# Patient Record
Sex: Female | Born: 1975 | Race: Black or African American | Hispanic: No | Marital: Married | Smoking: Never smoker
Health system: Southern US, Community
[De-identification: ages and names within clinical notes are randomized; demographics above are authoritative.]

## PROBLEM LIST (undated history)

## (undated) DIAGNOSIS — Z789 Other specified health status: Secondary | ICD-10-CM

## (undated) DIAGNOSIS — R42 Dizziness and giddiness: Secondary | ICD-10-CM

## (undated) HISTORY — DX: Other specified health status: Z78.9

## (undated) HISTORY — PX: OTHER SURGICAL HISTORY: SHX169

---

## 2018-03-11 IMAGING — US US TRANSVAGINAL NON-OB
1 series · 13 of 25 positions shown · non-contrast
Comparison: None.

CLINICAL DATA: 42-year-old female with acute pelvic pain for 1
week. Negative pregnancy test.



[Series 1: us transvaginal non-ob · 0.18mm/px · 13 of 114 slices shown]
[im 1/114]
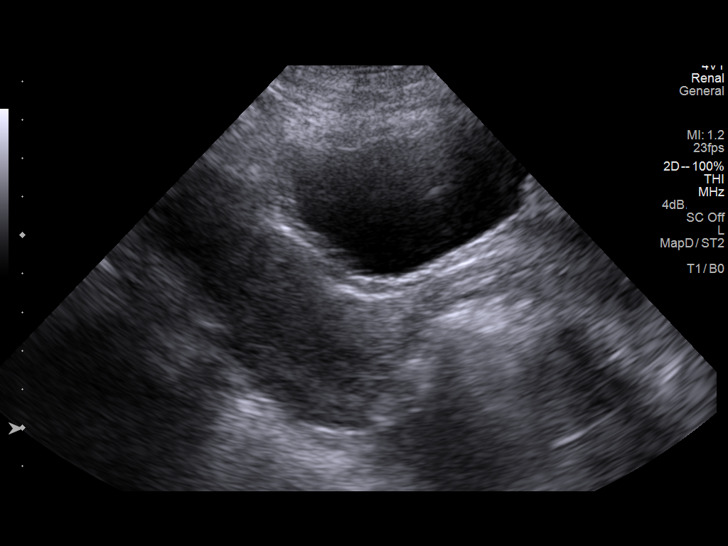
[im 10/114]
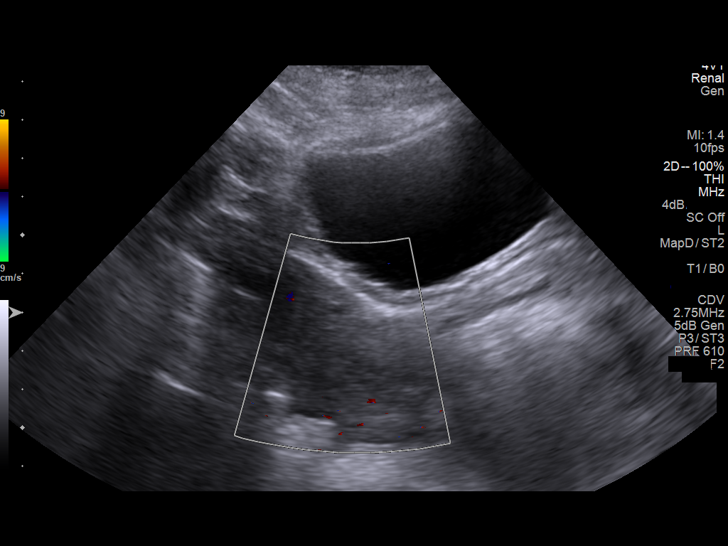
[im 19/114]
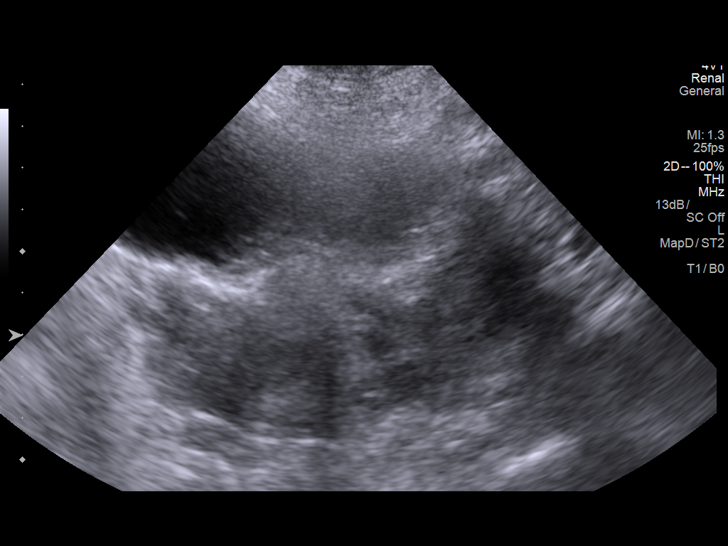
[im 29/114]
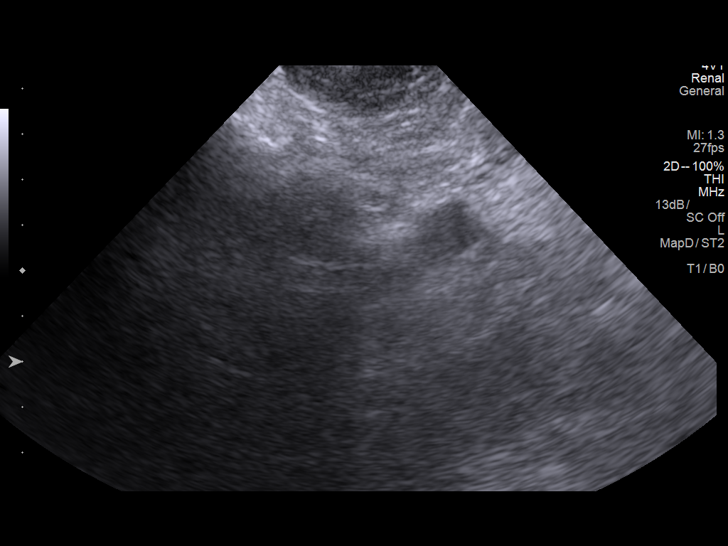
[im 38/114]
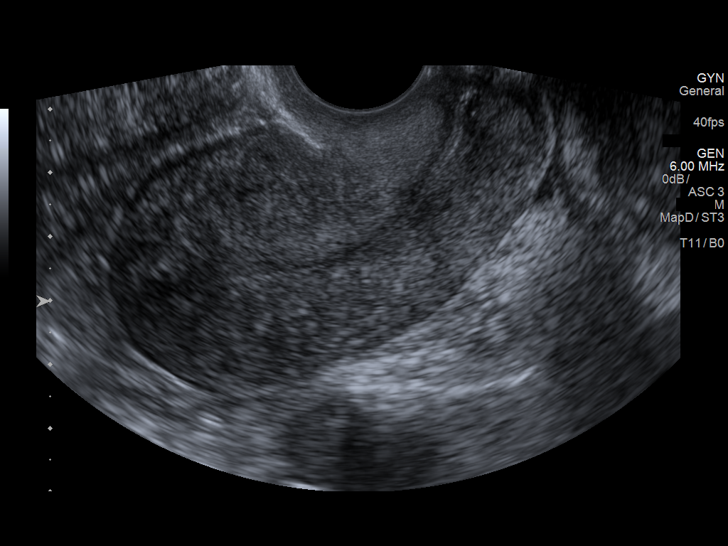
[im 48/114]
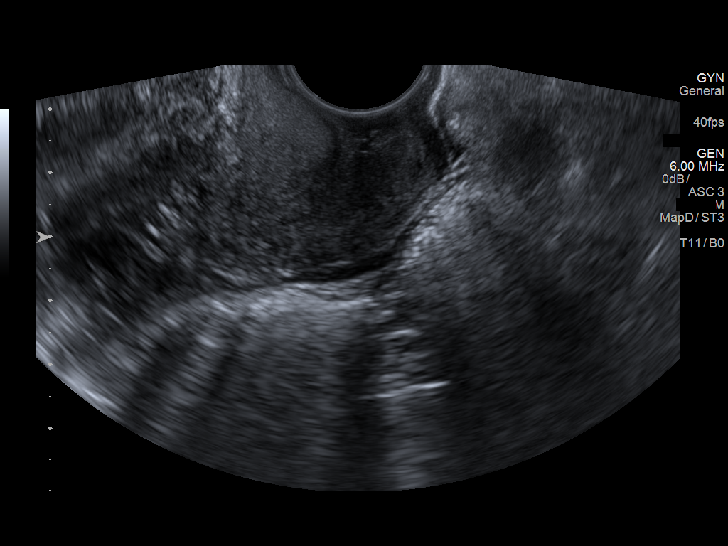
[im 57/114]
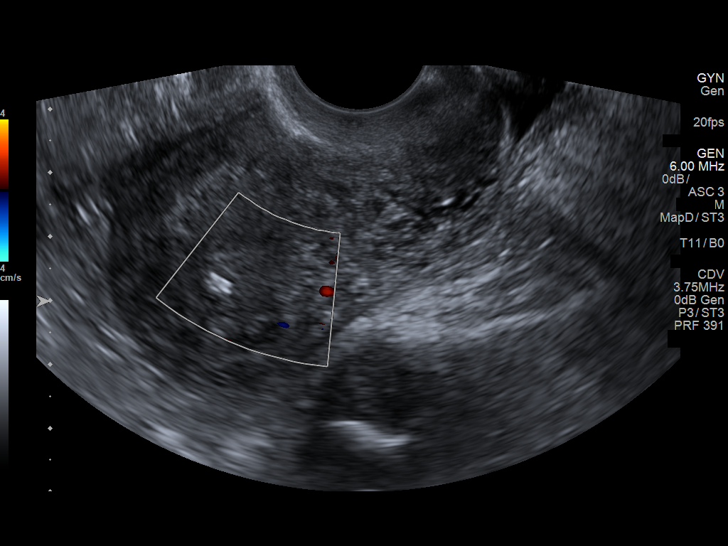
[im 66/114]
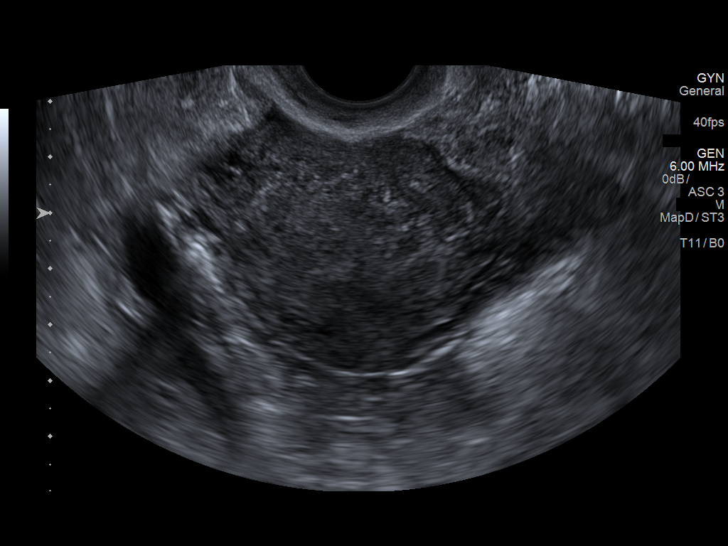
[im 76/114]
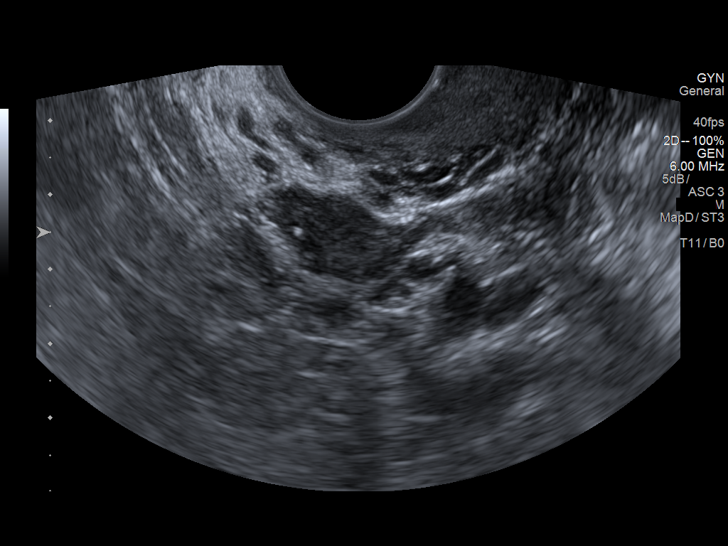
[im 85/114]
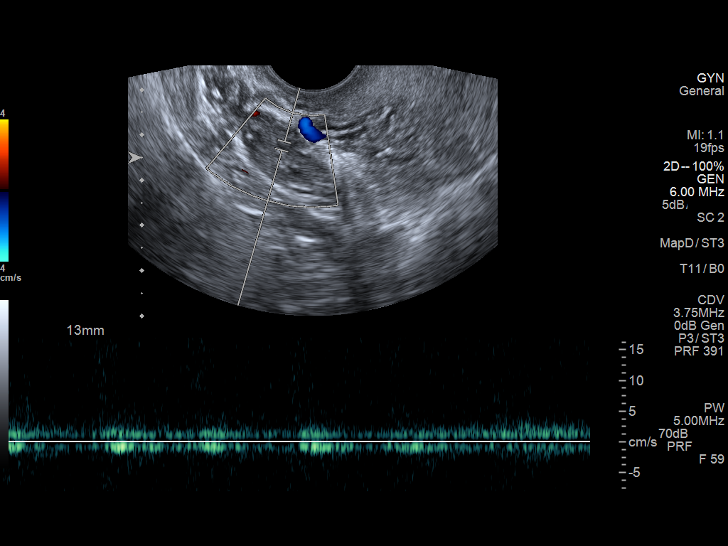
[im 95/114]
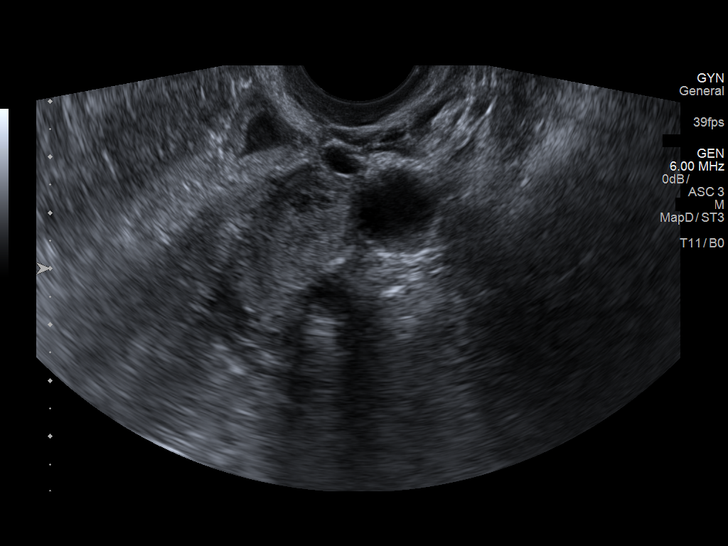
[im 104/114]
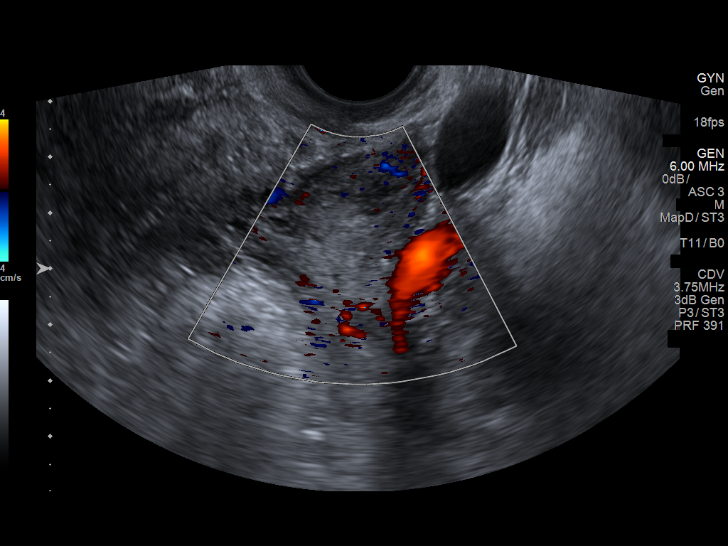
[im 114/114]
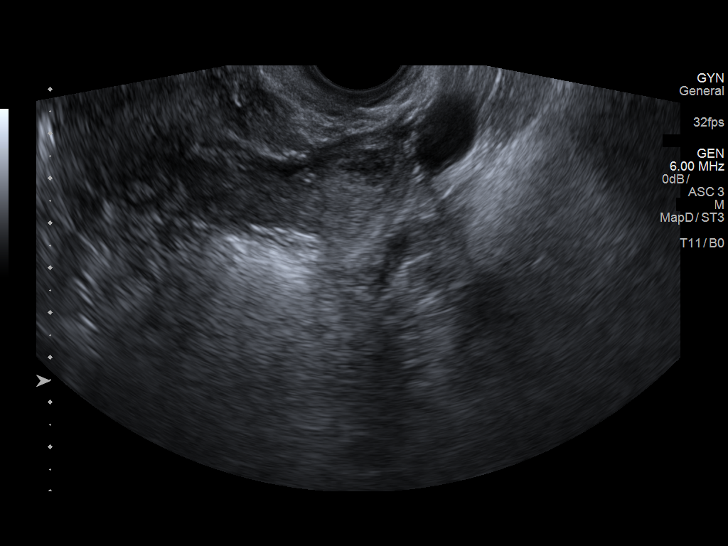

[13 of 25 positions shown; findings below may reference images not displayed]

FINDINGS: Uterus

Measurements: 7.7 x 3.9 x 4.5 cm. A 8 mm hyperechoic structure
within the uterine fundus adjacent to the endometrial stripe may
represent a calcification possibly representing prior
inflammation/infection or instrumentation.. No fibroids or other
mass visualized.

Endometrium

Thickness: 4 mm..  No focal abnormality visualized.

Right ovary

Measurements: 2.6 x 1.3 x 1.6 cm.. Normal appearance/no adnexal
mass.

Left ovary

Measurements: 4.9 x 2.3 x 2.6 cm. A 3 x 1.7 x 1.8 cm complex cyst is
noted.. No other abnormalities identified.

Pulsed Doppler evaluation of both ovaries demonstrates normal
low-resistance arterial and venous waveforms.

Other findings

Trace free fluid noted.
IMPRESSION: 1. 3 x 1.7 x 1.8 cm complex LEFT ovarian cyst which could represent
a corpus luteum. Follow-up ultrasound in 6-12 weeks recommended.
2. 8 mm hypoechoic structure within the uterine fundus along the
endometrium probably representing a calcification representing prior
inflammation/infection or instrumentation.
3. No evidence of ovarian torsion.
4. Trace free fluid which may be physiologic.

## 2018-04-30 ENCOUNTER — Other Ambulatory Visit: Payer: Self-pay

## 2018-04-30 ENCOUNTER — Emergency Department (HOSPITAL_COMMUNITY)
Admission: EM | Admit: 2018-04-30 | Discharge: 2018-04-30 | Disposition: A | Discharge status: Home / Self Care | Payer: Medicaid Other | Attending: Emergency Medicine | Admitting: Emergency Medicine

## 2018-04-30 ENCOUNTER — Encounter (HOSPITAL_COMMUNITY): Payer: Self-pay | Admitting: Emergency Medicine

## 2018-04-30 ENCOUNTER — Emergency Department (HOSPITAL_COMMUNITY): Payer: Medicaid Other

## 2018-04-30 DIAGNOSIS — R102 Pelvic and perineal pain: Secondary | ICD-10-CM | POA: Diagnosis present

## 2018-04-30 DIAGNOSIS — B9689 Other specified bacterial agents as the cause of diseases classified elsewhere: Secondary | ICD-10-CM | POA: Insufficient documentation

## 2018-04-30 DIAGNOSIS — N83202 Unspecified ovarian cyst, left side: Secondary | ICD-10-CM | POA: Diagnosis not present

## 2018-04-30 DIAGNOSIS — N760 Acute vaginitis: Secondary | ICD-10-CM | POA: Diagnosis not present

## 2018-04-30 DIAGNOSIS — R300 Dysuria: Secondary | ICD-10-CM | POA: Diagnosis not present

## 2018-04-30 LAB — CBC WITH DIFFERENTIAL/PLATELET
Abs Immature Granulocytes: 0 10*3/uL (ref 0.0–0.1)
Basophils Absolute: 0 10*3/uL (ref 0.0–0.1)
Basophils Relative: 1 %
Eosinophils Absolute: 0.2 10*3/uL (ref 0.0–0.7)
Eosinophils Relative: 4 %
HCT: 36.5 % (ref 36.0–46.0)
Hemoglobin: 11.4 g/dL — ABNORMAL LOW (ref 12.0–15.0)
Immature Granulocytes: 0 %
Lymphocytes Relative: 40 %
Lymphs Abs: 2 10*3/uL (ref 0.7–4.0)
MCH: 26.3 pg (ref 26.0–34.0)
MCHC: 31.2 g/dL (ref 30.0–36.0)
MCV: 84.1 fL (ref 78.0–100.0)
Monocytes Absolute: 0.5 10*3/uL (ref 0.1–1.0)
Monocytes Relative: 10 %
Neutro Abs: 2.3 10*3/uL (ref 1.7–7.7)
Neutrophils Relative %: 45 %
Platelets: 299 10*3/uL (ref 150–400)
RBC: 4.34 MIL/uL (ref 3.87–5.11)
RDW: 13.2 % (ref 11.5–15.5)
WBC: 5 10*3/uL (ref 4.0–10.5)

## 2018-04-30 LAB — URINALYSIS, ROUTINE W REFLEX MICROSCOPIC
Bacteria, UA: NONE SEEN
Bilirubin Urine: NEGATIVE
Glucose, UA: NEGATIVE mg/dL
Ketones, ur: NEGATIVE mg/dL
Leukocytes, UA: NEGATIVE
Nitrite: NEGATIVE
Protein, ur: NEGATIVE mg/dL
Specific Gravity, Urine: 1.02 (ref 1.005–1.030)
pH: 6 (ref 5.0–8.0)

## 2018-04-30 LAB — COMPREHENSIVE METABOLIC PANEL
ALT: 16 U/L (ref 14–54)
AST: 18 U/L (ref 15–41)
Albumin: 3.4 g/dL — ABNORMAL LOW (ref 3.5–5.0)
Alkaline Phosphatase: 48 U/L (ref 38–126)
Anion gap: 5 (ref 5–15)
BUN: 8 mg/dL (ref 6–20)
CO2: 25 mmol/L (ref 22–32)
Calcium: 9.5 mg/dL (ref 8.9–10.3)
Chloride: 112 mmol/L — ABNORMAL HIGH (ref 101–111)
Creatinine, Ser: 0.69 mg/dL (ref 0.44–1.00)
GFR calc Af Amer: >60 mL/min (ref >60)
GFR calc non Af Amer: >60 mL/min (ref >60)
Glucose, Bld: 92 mg/dL (ref 65–99)
Potassium: 3.8 mmol/L (ref 3.5–5.1)
Sodium: 142 mmol/L (ref 135–145)
Total Bilirubin: 0.5 mg/dL (ref 0.3–1.2)
Total Protein: 6.5 g/dL (ref 6.5–8.1)

## 2018-04-30 LAB — WET PREP, GENITAL
Sperm: NONE SEEN
Trich, Wet Prep: NONE SEEN
Yeast Wet Prep HPF POC: NONE SEEN

## 2018-04-30 LAB — POC URINE PREG, ED: Preg Test, Ur: NEGATIVE

## 2018-04-30 MED ORDER — IBUPROFEN 800 MG PO TABS: 800.0000 mg | ORAL_TABLET | Freq: Three times a day (TID) | ORAL | 0 refills | Status: DC

## 2018-04-30 MED ORDER — KETOROLAC TROMETHAMINE 30 MG/ML IJ SOLN
30.0000 mg | Freq: Once | INTRAMUSCULAR | Status: AC
  Administered 2018-04-30: 30 mg via INTRAVENOUS
  Filled 2018-04-30: qty 1

## 2018-04-30 MED ORDER — SODIUM CHLORIDE 0.9 % IV BOLUS
1000.0000 mL | Freq: Once | INTRAVENOUS | Status: AC
  Administered 2018-04-30: 1000 mL via INTRAVENOUS

## 2018-04-30 MED ORDER — METRONIDAZOLE 500 MG PO TABS: 500.0000 mg | ORAL_TABLET | Freq: Two times a day (BID) | ORAL | 0 refills | Status: DC

## 2018-04-30 MED ORDER — METRONIDAZOLE 500 MG PO TABS
500.0000 mg | ORAL_TABLET | Freq: Once | ORAL | Status: AC
  Administered 2018-04-30: 500 mg via ORAL
  Filled 2018-04-30: qty 1

## 2018-04-30 NOTE — Discharge Instructions (Addendum)
Take motrin as needed for pain.   Take flagyl twice daily for a week.   [facility] if you still have pain   Return to ER if you have worse abdominal pain, vomiting, vaginal discharge, fever.

## 2018-04-30 NOTE — ED Notes (Addendum)
Please note: Gold top also collected with CMP and CBC; all have been sent down to Main Lab with instructions to hold.

## 2018-04-30 NOTE — ED Notes (Signed)
Pt verbalized understanding of discharge instructions.

## 2018-04-30 NOTE — ED Provider Notes (Signed)
[facility] EMERGENCY DEPARTMENT Provider Note   CSN: [id number] Arrival date & time: 04/30/18  8657     History   Chief Complaint Chief Complaint  Patient presents with  . Abdominal Pain    HPI Sara Rich is a 42 y.o. female here presenting with pelvic pain, dysuria, pelvic discharge.  Vision states that she has lower abdominal pain for the last week or so.  She states that is diffuse and cramping in sensation.  2 days ago she started her period and her pain got worse.  She denies any nausea or vomiting or diarrhea or fevers.  She states that she is concerned that she may be pregnant since didn't get a new implant for several years.   The history is provided by the patient.    No past medical history on file.  There are no active problems to display for this patient.    OB History   None      Home Medications    Prior to Admission medications   Not on File    Family History No family history on file.  Social History Social History   Tobacco Use  . Smoking status: Never Smoker  Substance Use Topics  . Alcohol use: Never    Frequency: Never  . Drug use: Never     Allergies   Patient has no allergy information on record.   Review of Systems Review of Systems  Gastrointestinal: Positive for abdominal pain.  Genitourinary: Positive for dysuria, pelvic pain and vaginal discharge.  All other systems reviewed and are negative.    Physical Exam Updated Vital Signs BP 120/78   Pulse (!) 53   Temp 98.4 F (36.9 C) (Oral)   Resp 18   SpO2 100%   Physical Exam  Constitutional: She appears well-developed.  HENT:  Head: Normocephalic.  Mouth/Throat: Oropharynx is clear and moist.  Eyes: Pupils are equal, round, and reactive to light. EOM are normal.  Cardiovascular: Normal rate, regular rhythm and normal heart sounds.  Pulmonary/Chest: Effort normal and breath sounds normal.  Abdominal: Normal appearance and bowel sounds  are normal.  Mild diffuse lower abdominal and pelvic tenderness, no rebound   Genitourinary:  Genitourinary Comments: Some blood around the os. Os closed and no CMT. Diffuse pelvic tenderness   Neurological: She is alert.  Skin: Skin is warm. Capillary refill takes less than 2 seconds.  Nursing note and vitals reviewed.    ED Treatments / Results  Labs (all labs ordered are listed, but only abnormal results are displayed) Labs Reviewed  WET PREP, GENITAL - Abnormal; Notable for the following components:      Result Value   Clue Cells Wet Prep HPF POC PRESENT (*)    WBC, Wet Prep HPF POC MANY (*)    All other components within normal limits  URINALYSIS, ROUTINE W REFLEX MICROSCOPIC - Abnormal; Notable for the following components:   Hgb urine dipstick MODERATE (*)    All other components within normal limits  CBC WITH DIFFERENTIAL/PLATELET - Abnormal; Notable for the following components:   Hemoglobin 11.4 (*)    All other components within normal limits  COMPREHENSIVE METABOLIC PANEL - Abnormal; Notable for the following components:   Chloride 112 (*)    Albumin 3.4 (*)    All other components within normal limits  POC URINE PREG, ED  GC/CHLAMYDIA PROBE AMP ([employer]) NOT AT [facility]    EKG None  Radiology US Transvaginal Non-ob  Result Date:  04/30/2018 CLINICAL DATA:  42 year old female with acute pelvic pain for 1 week. Negative pregnancy test. EXAM: TRANSABDOMINAL AND TRANSVAGINAL ULTRASOUND OF PELVIS DOPPLER ULTRASOUND OF OVARIES TECHNIQUE: Both transabdominal and transvaginal ultrasound examinations of the pelvis were performed. Transabdominal technique was performed for global imaging of the pelvis including uterus, ovaries, adnexal regions, and pelvic cul-de-sac. It was necessary to proceed with endovaginal exam following the transabdominal exam to visualize the ovaries and endometrium. Color and duplex Doppler ultrasound was utilized to evaluate blood flow to the  ovaries. COMPARISON:  None. FINDINGS: Uterus Measurements: 7.7 x 3.9 x 4.5 cm. A 8 mm hyperechoic structure within the uterine fundus adjacent to the endometrial stripe may represent a calcification possibly representing prior inflammation/infection or instrumentation. No fibroids or other mass visualized. Endometrium Thickness: 4 mm.  No focal abnormality visualized. Right ovary Measurements: 2.6 x 1.3 x 1.6 cm. Normal appearance/no adnexal mass. Left ovary Measurements: 4.9 x 2.3 x 2.6 cm. A 3 x 1.7 x 1.8 cm complex cyst is noted. No other abnormalities identified. Pulsed Doppler evaluation of both ovaries demonstrates normal low-resistance arterial and venous waveforms. Other findings Trace free fluid noted. IMPRESSION: 1. 3 x 1.7 x 1.8 cm complex LEFT ovarian cyst which could represent a corpus luteum. Follow-up ultrasound in 6-12 weeks recommended. 2. 8 mm hypoechoic structure within the uterine fundus along the endometrium probably representing a calcification representing prior inflammation/infection or instrumentation. 3. No evidence of ovarian torsion. 4. Trace free fluid which may be physiologic. Electronically Signed   By: Harmon Pier M.D.   On: 04/30/2018 14:47   US Pelvis Complete  Result Date: 04/30/2018 CLINICAL DATA:  42 year old female with acute pelvic pain for 1 week. Negative pregnancy test. EXAM: TRANSABDOMINAL AND TRANSVAGINAL ULTRASOUND OF PELVIS DOPPLER ULTRASOUND OF OVARIES TECHNIQUE: Both transabdominal and transvaginal ultrasound examinations of the pelvis were performed. Transabdominal technique was performed for global imaging of the pelvis including uterus, ovaries, adnexal regions, and pelvic cul-de-sac. It was necessary to proceed with endovaginal exam following the transabdominal exam to visualize the ovaries and endometrium. Color and duplex Doppler ultrasound was utilized to evaluate blood flow to the ovaries. COMPARISON:  None. FINDINGS: Uterus Measurements: 7.7 x 3.9 x 4.5  cm. A 8 mm hyperechoic structure within the uterine fundus adjacent to the endometrial stripe may represent a calcification possibly representing prior inflammation/infection or instrumentation. No fibroids or other mass visualized. Endometrium Thickness: 4 mm.  No focal abnormality visualized. Right ovary Measurements: 2.6 x 1.3 x 1.6 cm. Normal appearance/no adnexal mass. Left ovary Measurements: 4.9 x 2.3 x 2.6 cm. A 3 x 1.7 x 1.8 cm complex cyst is noted. No other abnormalities identified. Pulsed Doppler evaluation of both ovaries demonstrates normal low-resistance arterial and venous waveforms. Other findings Trace free fluid noted. IMPRESSION: 1. 3 x 1.7 x 1.8 cm complex LEFT ovarian cyst which could represent a corpus luteum. Follow-up ultrasound in 6-12 weeks recommended. 2. 8 mm hypoechoic structure within the uterine fundus along the endometrium probably representing a calcification representing prior inflammation/infection or instrumentation. 3. No evidence of ovarian torsion. 4. Trace free fluid which may be physiologic. Electronically Signed   By: Harmon Pier M.D.   On: 04/30/2018 14:47   [place] Art/ven Flow Abd Pelv Doppler  Result Date: 04/30/2018 CLINICAL DATA:  42 year old female with acute pelvic pain for 1 week. Negative pregnancy test. EXAM: TRANSABDOMINAL AND TRANSVAGINAL ULTRASOUND OF PELVIS DOPPLER ULTRASOUND OF OVARIES TECHNIQUE: Both transabdominal and transvaginal ultrasound examinations of the pelvis were performed. Transabdominal technique  was performed for global imaging of the pelvis including uterus, ovaries, adnexal regions, and pelvic cul-de-sac. It was necessary to proceed with endovaginal exam following the transabdominal exam to visualize the ovaries and endometrium. Color and duplex Doppler ultrasound was utilized to evaluate blood flow to the ovaries. COMPARISON:  None. FINDINGS: Uterus Measurements: 7.7 x 3.9 x 4.5 cm. A 8 mm hyperechoic structure within the uterine fundus  adjacent to the endometrial stripe may represent a calcification possibly representing prior inflammation/infection or instrumentation. No fibroids or other mass visualized. Endometrium Thickness: 4 mm.  No focal abnormality visualized. Right ovary Measurements: 2.6 x 1.3 x 1.6 cm. Normal appearance/no adnexal mass. Left ovary Measurements: 4.9 x 2.3 x 2.6 cm. A 3 x 1.7 x 1.8 cm complex cyst is noted. No other abnormalities identified. Pulsed Doppler evaluation of both ovaries demonstrates normal low-resistance arterial and venous waveforms. Other findings Trace free fluid noted. IMPRESSION: 1. 3 x 1.7 x 1.8 cm complex LEFT ovarian cyst which could represent a corpus luteum. Follow-up ultrasound in 6-12 weeks recommended. 2. 8 mm hypoechoic structure within the uterine fundus along the endometrium probably representing a calcification representing prior inflammation/infection or instrumentation. 3. No evidence of ovarian torsion. 4. Trace free fluid which may be physiologic. Electronically Signed   By: Harmon Pier M.D.   On: 04/30/2018 14:47    Procedures Procedures (including critical care time)  Medications Ordered in ED Medications  metroNIDAZOLE (FLAGYL) tablet 500 mg (has no administration in time range)  sodium chloride 0.9 % bolus 1,000 mL (0 mLs Intravenous Stopped 04/30/18 1317)  ketorolac (TORADOL) 30 MG/ML injection 30 mg (30 mg Intravenous Given 04/30/18 1256)     Initial Impression / Assessment and Plan / ED Course  I have reviewed the triage vital signs and the nursing notes.  Pertinent labs & imaging results that were available during my care of the patient were reviewed by me and considered in my medical decision making (see chart for details).     Donatella Frayre is a 42 y.o. female here with abdominal pain, pelvic discharge. Consider PID vs UTI vs tuboovarian abscess. Will get labs, US pelvis, UA.   3:51 PM US showed L ovarian cyst. UCG neg. Wet prep + BV. UA showed no  obvious UTI. Pain controlled with toradol. Will dc home with flagyl, motrin. Will have her follow up at [facility] for repeat US.    Final Clinical Impressions(s) / ED Diagnoses   Final diagnoses:  None    ED Discharge Orders    None       Charlynne Pander, MD 04/30/18 1553

## 2018-04-30 NOTE — ED Notes (Signed)
Pt transported to Ultrasound.  

## 2018-04-30 NOTE — ED Triage Notes (Signed)
Patient to ED c/o lower abdominal pain, intermittent x 1 week with urinary frequency. Patient denies fevers/chills. She states her main concern, however, is the implant in her L arm has expired (been there for 3 years) and the person who put it in is in [place]. Denies pain or problems with implant in arm.

## 2018-04-30 NOTE — ED Notes (Signed)
Pt remains in imaging at this time. Family at the bedside.

## 2018-05-01 LAB — GC/CHLAMYDIA PROBE AMP (~~LOC~~) NOT AT ARMC
Chlamydia: NEGATIVE
Neisseria Gonorrhea: NEGATIVE

## 2018-05-26 ENCOUNTER — Other Ambulatory Visit: Payer: Self-pay | Admitting: Nurse Practitioner

## 2018-05-26 DIAGNOSIS — Z1231 Encounter for screening mammogram for malignant neoplasm of breast: Secondary | ICD-10-CM

## 2018-05-29 ENCOUNTER — Ambulatory Visit
Admission: RE | Admit: 2018-05-29 | Discharge: 2018-05-29 | Disposition: A | Discharge status: Home / Self Care | Payer: Medicaid Other | Source: Ambulatory Visit | Attending: Nurse Practitioner | Admitting: Nurse Practitioner

## 2018-05-29 DIAGNOSIS — Z1231 Encounter for screening mammogram for malignant neoplasm of breast: Secondary | ICD-10-CM

## 2018-06-01 ENCOUNTER — Ambulatory Visit: Payer: Self-pay | Admitting: Nurse Practitioner

## 2018-06-01 ENCOUNTER — Ambulatory Visit: Payer: Medicaid Other | Admitting: Nurse Practitioner

## 2018-06-22 IMAGING — US US PELVIS COMPLETE
1 series · 13 of 25 positions shown · non-contrast
Comparison: None

CLINICAL DATA: Follow-up left ovarian cyst

EXAM:
TRANSABDOMINAL AND TRANSVAGINAL ULTRASOUND OF PELVIS
TECHNIQUE: Both transabdominal and transvaginal ultrasound examinations of the
pelvis were performed. Transabdominal technique was performed for
global imaging of the pelvis including uterus, ovaries, adnexal
regions, and pelvic cul-de-sac. It was necessary to proceed with
endovaginal exam following the transabdominal exam to visualize the
endometrium and ovaries.

[Series 1: us pelvis complete · 0.24mm/px · 13 of 118 slices shown]
[im 1/118]
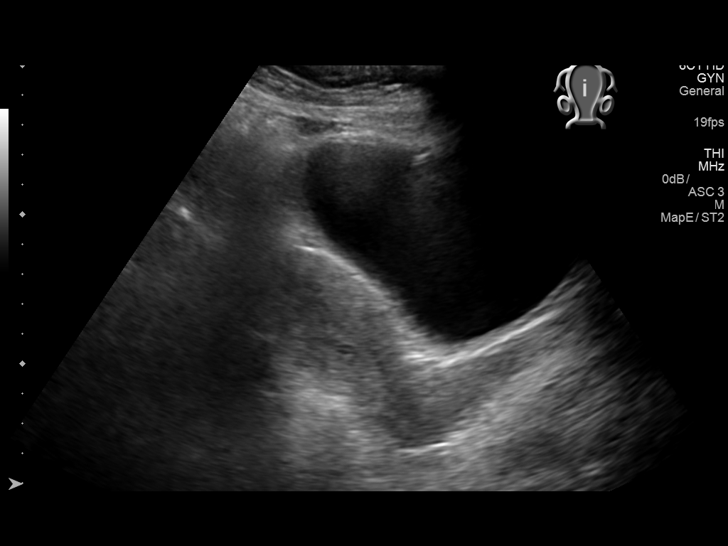
[im 10/118]
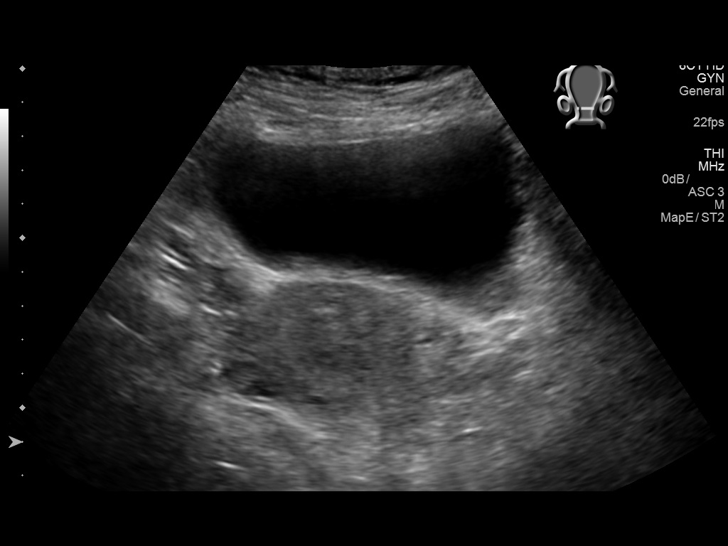
[im 20/118]
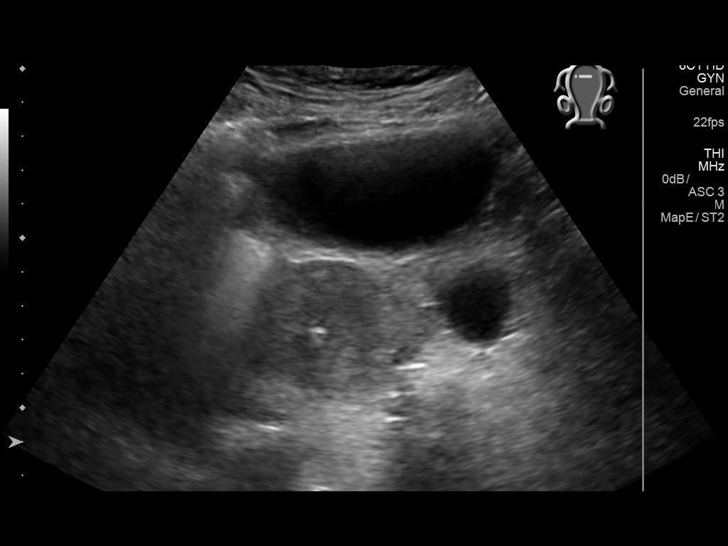
[im 30/118]
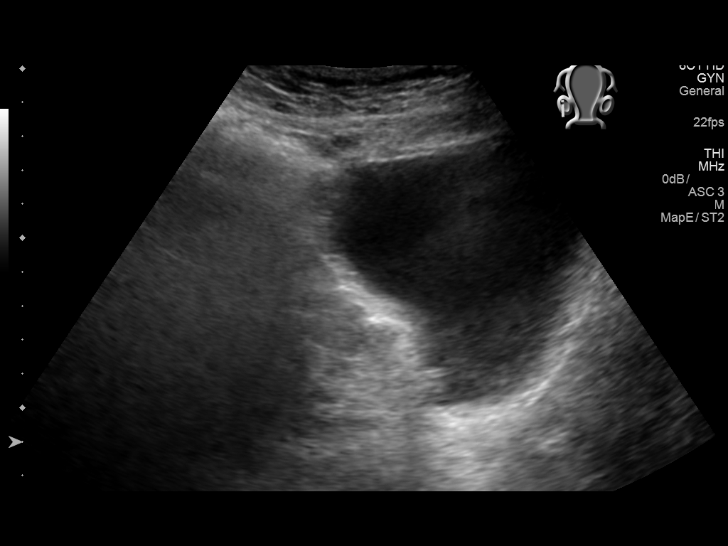
[im 40/118]
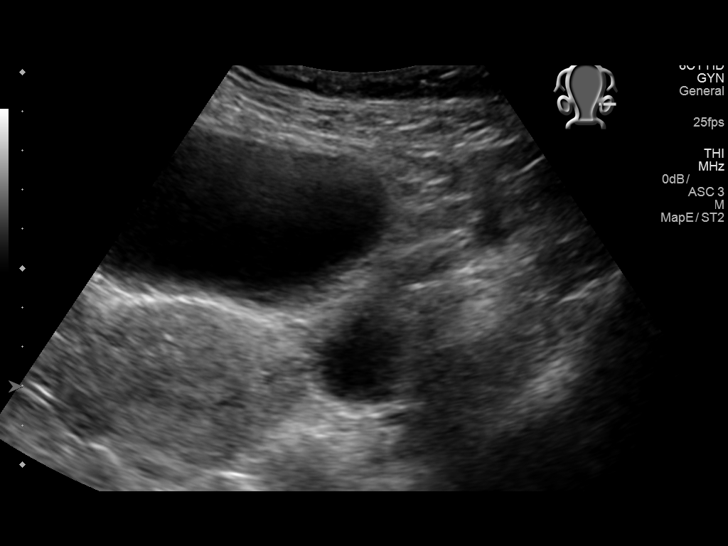
[im 49/118]
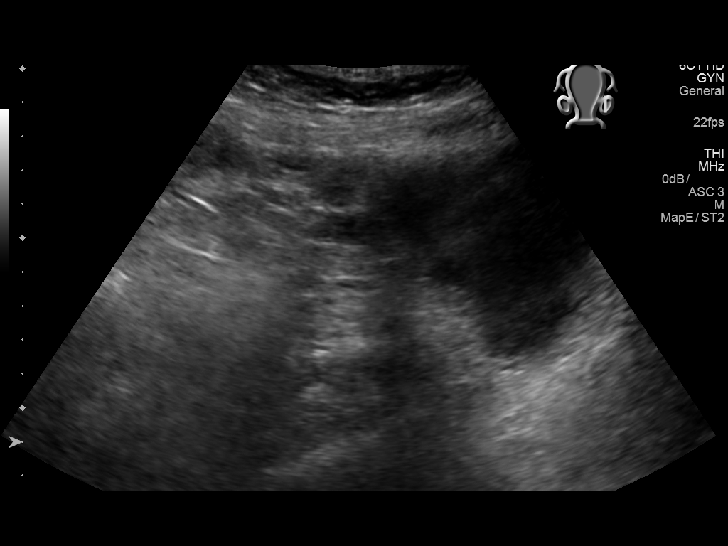
[im 59/118]
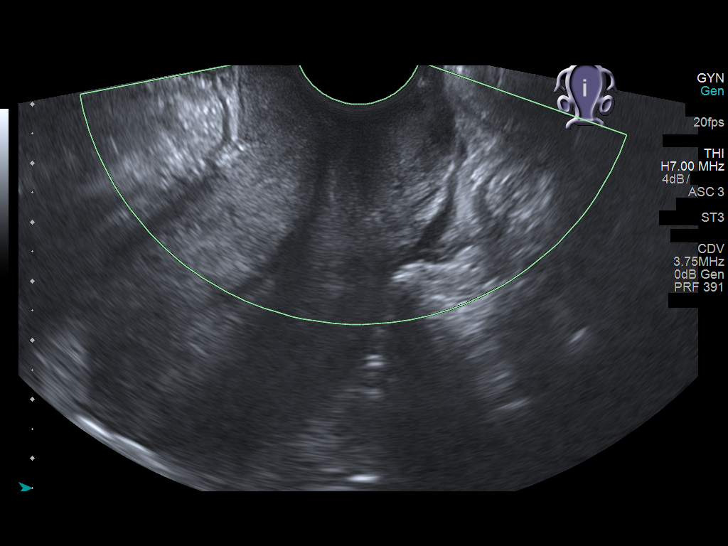
[im 69/118]
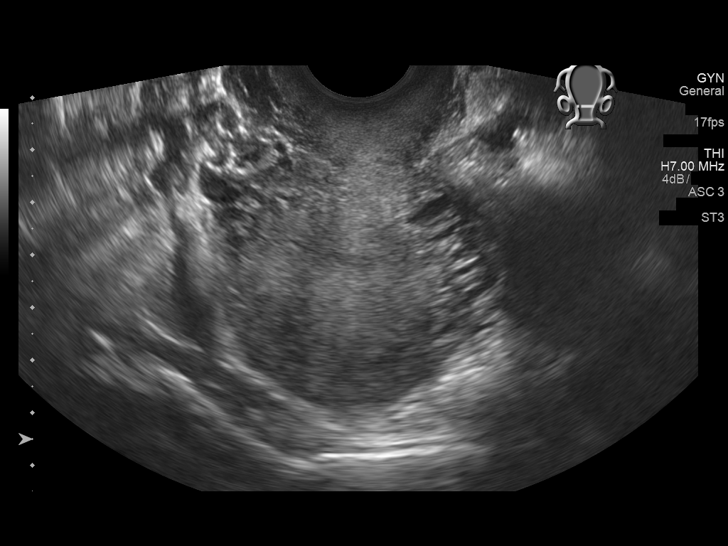
[im 79/118]
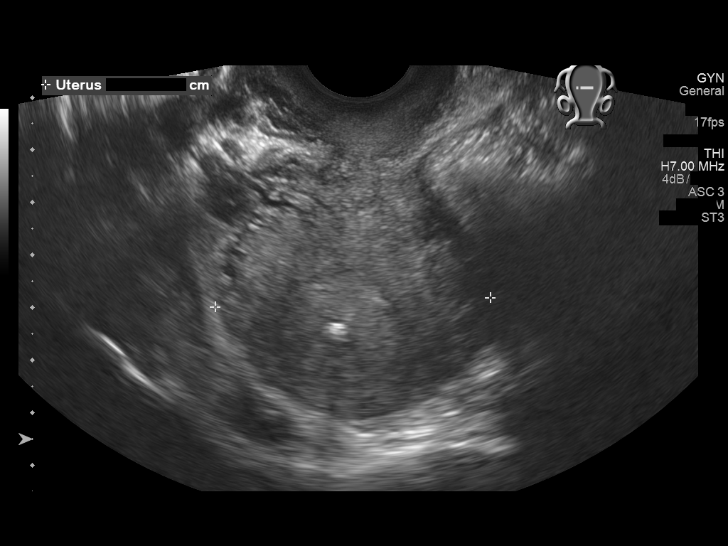
[im 88/118]
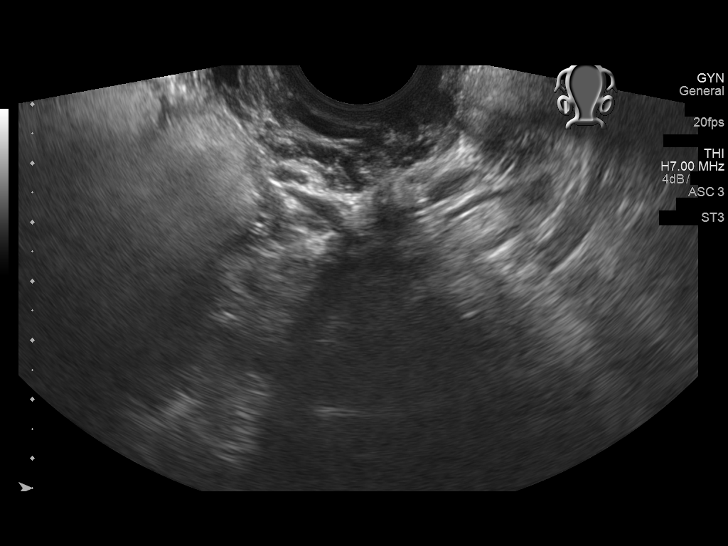
[im 98/118]
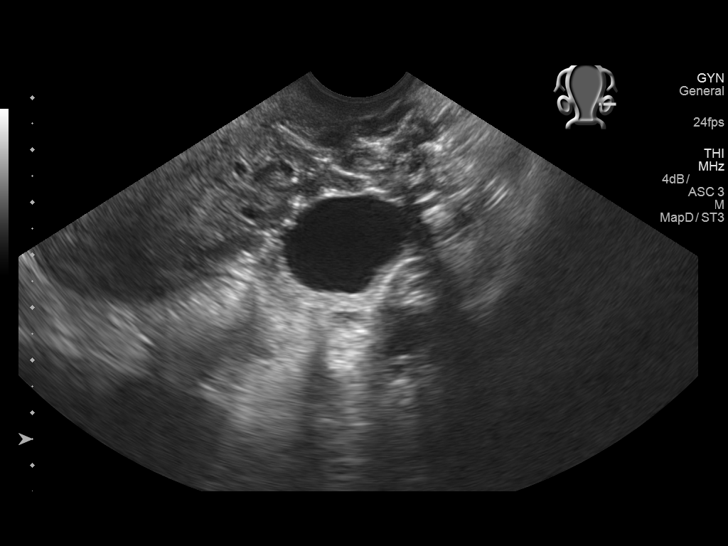
[im 108/118]
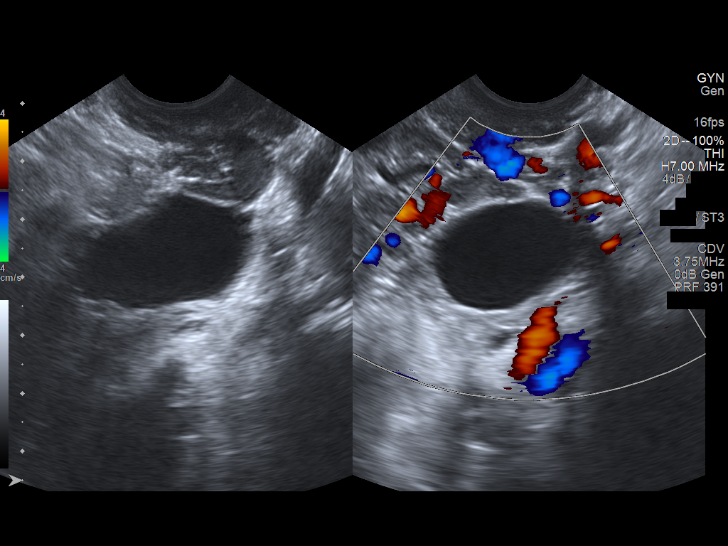
[im 118/118]
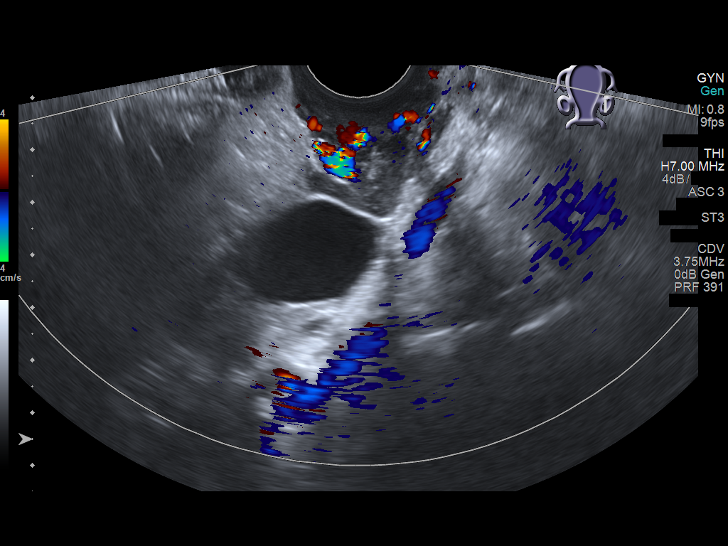

[13 of 25 positions shown; findings below may reference images not displayed]

FINDINGS: Uterus

Measurements: 7.9 x 4.6 x 5.2 cm. A 7.3 x 4.3 x 6.6 mm calcification
is seen in the uterine fundus, not significantly changed in the
interval.

Endometrium

Thickness: 6.9 mm.  No focal abnormality visualized.

Right ovary

Not visualized.

Left ovary

Measurements: 3.25 x 2.27 x 3.62 cm. The complicated cyst has
resolved. There is now a 2.9 cm dominant follicle requiring no
follow-up.

Other findings

Trace fluid in the cul-de-sac, physiologic.
IMPRESSION: 1. A 7 mm calcification in the uterine fundus is likely sequela of
previous infection/inflammation or instrumentation. No change.
2. The complicated cyst in the left ovary has resolved. A dominant
2.9 cm anechoic follicle is seen, requiring no follow-up.
3. The right ovary is not seen.
4. Trace physiologic fluid in the cul-de-sac.

## 2018-07-09 ENCOUNTER — Ambulatory Visit: Payer: Medicaid Other | Admitting: Internal Medicine

## 2018-08-03 ENCOUNTER — Ambulatory Visit: Payer: Medicaid Other | Attending: Internal Medicine | Admitting: Internal Medicine

## 2018-08-03 ENCOUNTER — Encounter: Payer: Self-pay | Admitting: Internal Medicine

## 2018-08-03 ENCOUNTER — Telehealth: Payer: Self-pay

## 2018-08-03 ENCOUNTER — Telehealth: Payer: Self-pay | Admitting: *Deleted

## 2018-08-03 VITALS — BP 133/81 | HR 61 | Temp 98.3°F | Resp 16 | Ht 64.5 in | Wt 196.4 lb

## 2018-08-03 DIAGNOSIS — R21 Rash and other nonspecific skin eruption: Secondary | ICD-10-CM | POA: Insufficient documentation

## 2018-08-03 DIAGNOSIS — N83202 Unspecified ovarian cyst, left side: Secondary | ICD-10-CM | POA: Diagnosis not present

## 2018-08-03 DIAGNOSIS — M542 Cervicalgia: Secondary | ICD-10-CM | POA: Diagnosis present

## 2018-08-03 DIAGNOSIS — R030 Elevated blood-pressure reading, without diagnosis of hypertension: Secondary | ICD-10-CM | POA: Insufficient documentation

## 2018-08-03 MED ORDER — DICLOFENAC SODIUM 1 % TD GEL: 2.0000 g | Freq: Four times a day (QID) | TRANSDERMAL | 3 refills | Status: DC

## 2018-08-03 MED ORDER — CYCLOBENZAPRINE HCL 5 MG PO TABS: 5.0000 mg | ORAL_TABLET | Freq: Every day | ORAL | 1 refills | Status: DC | PRN

## 2018-08-03 MED ORDER — TRIAMCINOLONE ACETONIDE 0.1 % EX CREA
1.0000 | TOPICAL_CREAM | Freq: Two times a day (BID) | CUTANEOUS | 0 refills | Status: DC
Start: 2018-08-03 — End: 2019-12-13

## 2018-08-03 MED FILL — TRIAMCINOLONE 0.1% CREAM: 0.1 | 7 days supply | Qty: 30 | Fill #0

## 2018-08-03 MED FILL — VOLTAREN 1% GEL: 1 | 12 days supply | Qty: 100 | Fill #0

## 2018-08-03 MED FILL — CYCLOBENZAPRINE 5 MG TABLET: 5 | 30 days supply | Qty: 30 | Fill #0

## 2018-08-03 NOTE — Progress Notes (Signed)
Pt states she has no appetite . 

## 2018-08-03 NOTE — Telephone Encounter (Signed)
Went on the [employer] website and did prior auth for US   CPT- [phone] US transvaginal non on           [phone] US pelvis complete  ICD- N83.202 Cyst on left ovary, Ovarian cyst  Authorization # [id number] Effective- 08/03/2018 Expires- 09/02/2018  Contacted the scheduling department and spoke with Sheralyn Boatmanoni and provided authorization #
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
## 2018-08-03 NOTE — Telephone Encounter (Signed)
Pt came in to request a note for work she states that due to the new prescribed medication she will need a week off work,

## 2018-08-11 ENCOUNTER — Ambulatory Visit (HOSPITAL_COMMUNITY)
Admission: RE | Admit: 2018-08-11 | Discharge: 2018-08-11 | Disposition: A | Discharge status: Home / Self Care | Payer: Medicaid Other | Source: Ambulatory Visit | Attending: Internal Medicine | Admitting: Internal Medicine

## 2018-08-11 DIAGNOSIS — N83202 Unspecified ovarian cyst, left side: Secondary | ICD-10-CM | POA: Diagnosis present

## 2018-08-11 DIAGNOSIS — N858 Other specified noninflammatory disorders of uterus: Secondary | ICD-10-CM | POA: Insufficient documentation

## 2018-08-25 ENCOUNTER — Ambulatory Visit: Payer: Medicaid Other | Admitting: Family Medicine

## 2018-08-25 ENCOUNTER — Encounter: Payer: Self-pay | Admitting: Family Medicine

## 2018-08-25 ENCOUNTER — Ambulatory Visit (HOSPITAL_COMMUNITY)
Admission: RE | Admit: 2018-08-25 | Discharge: 2018-08-25 | Disposition: A | Discharge status: Home / Self Care | Payer: Medicaid Other | Source: Ambulatory Visit | Attending: Family Medicine | Admitting: Family Medicine

## 2018-08-25 ENCOUNTER — Encounter: Payer: Self-pay | Admitting: Internal Medicine

## 2018-08-25 ENCOUNTER — Other Ambulatory Visit: Payer: Self-pay

## 2018-08-25 VITALS — BP 134/72 | HR 62 | Temp 98.2°F | Resp 18 | Ht 65.0 in | Wt 194.6 lb

## 2018-08-25 DIAGNOSIS — S60311A Abrasion of right thumb, initial encounter: Secondary | ICD-10-CM | POA: Diagnosis not present

## 2018-08-25 DIAGNOSIS — L089 Local infection of the skin and subcutaneous tissue, unspecified: Secondary | ICD-10-CM

## 2018-08-25 DIAGNOSIS — Z23 Encounter for immunization: Secondary | ICD-10-CM

## 2018-08-25 DIAGNOSIS — X58XXXA Exposure to other specified factors, initial encounter: Secondary | ICD-10-CM | POA: Insufficient documentation

## 2018-08-25 IMAGING — CR DG HAND COMPLETE 3+V*R*
4 series · 4 of 4 positions shown · non-contrast
Comparison: None.

CLINICAL DATA: Infected abrasion of right thumb.

EXAM:
RIGHT HAND - COMPLETE 3+ VIEW

[hand pa]
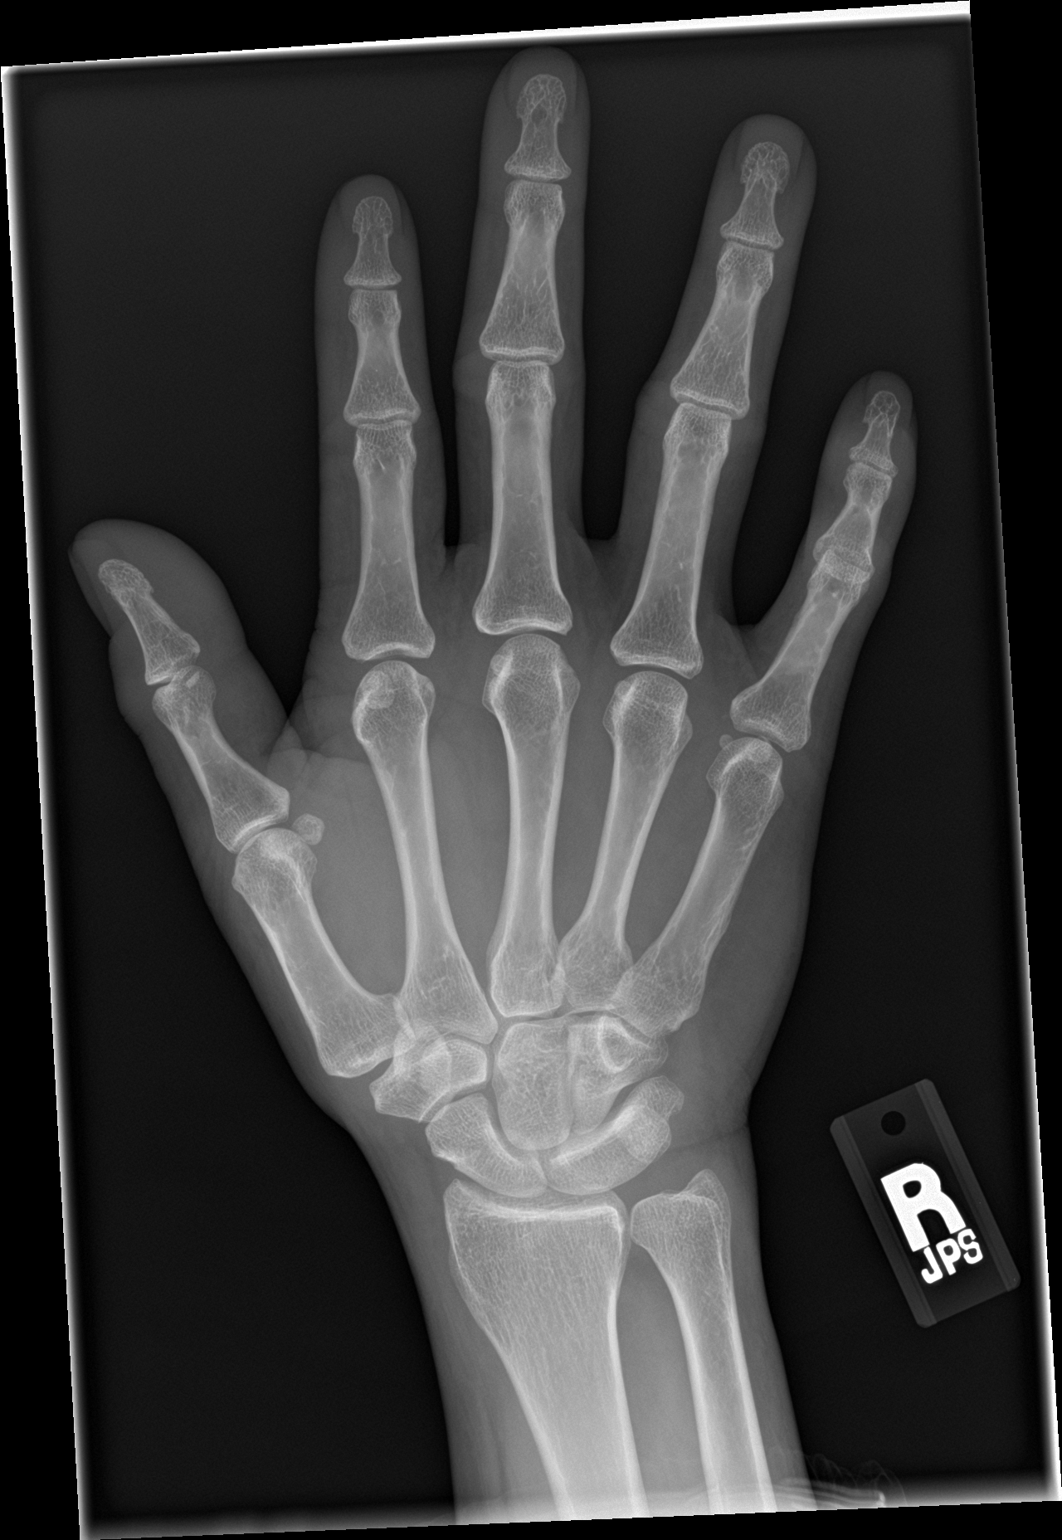

[hand obl]
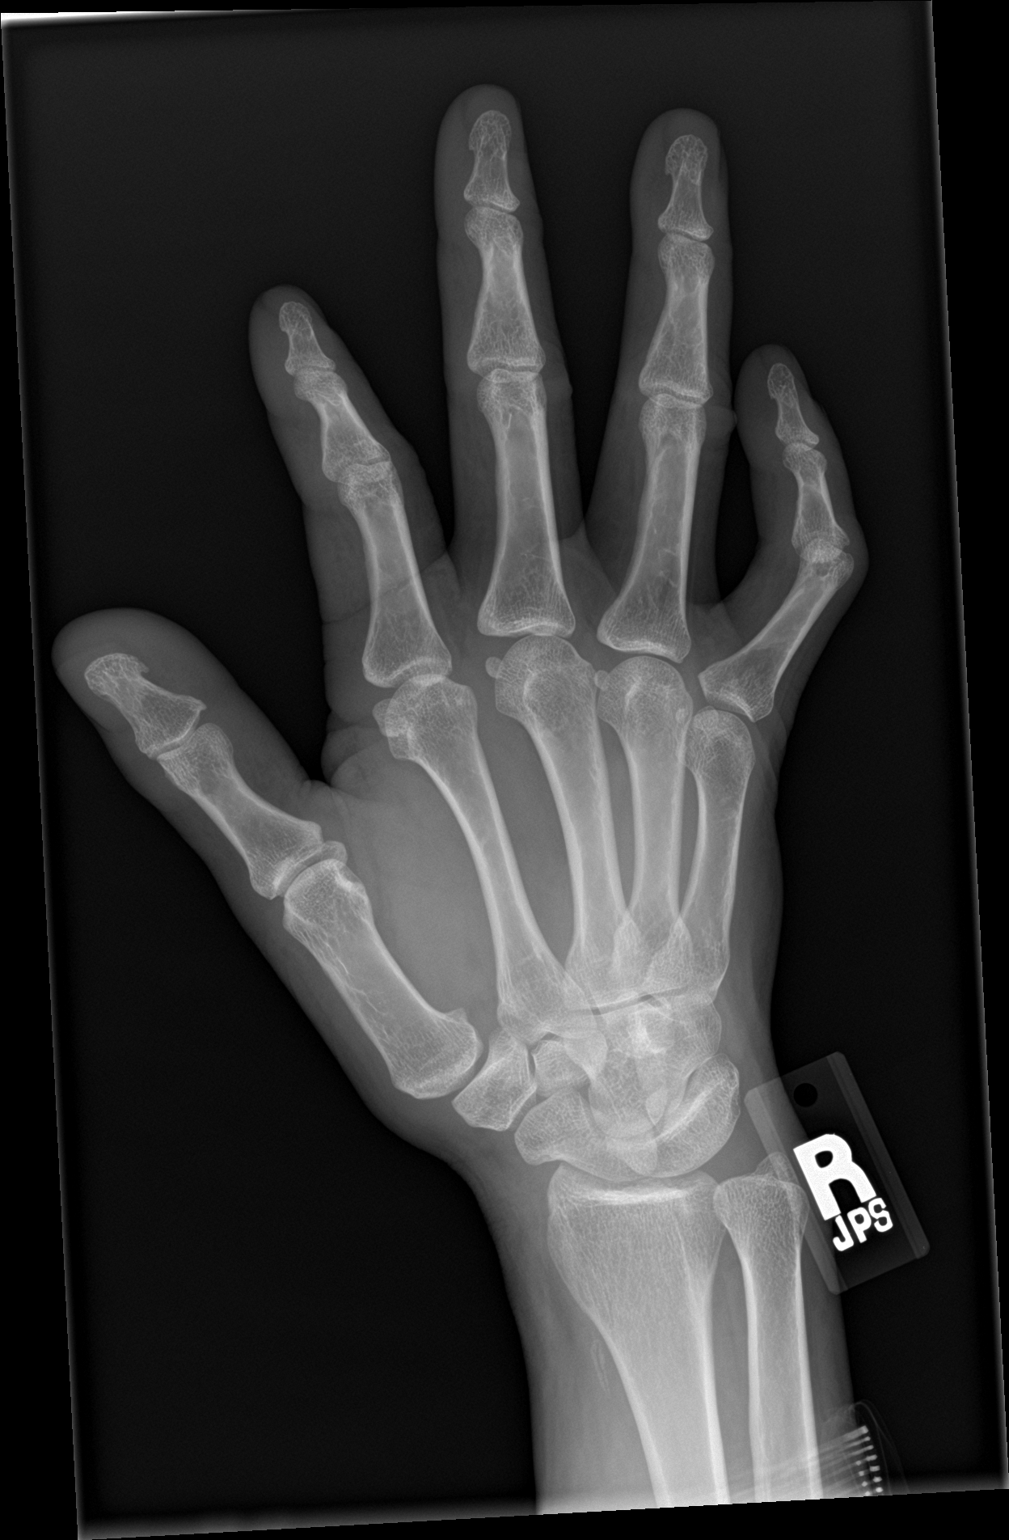

[hand lat (1 of 2)]
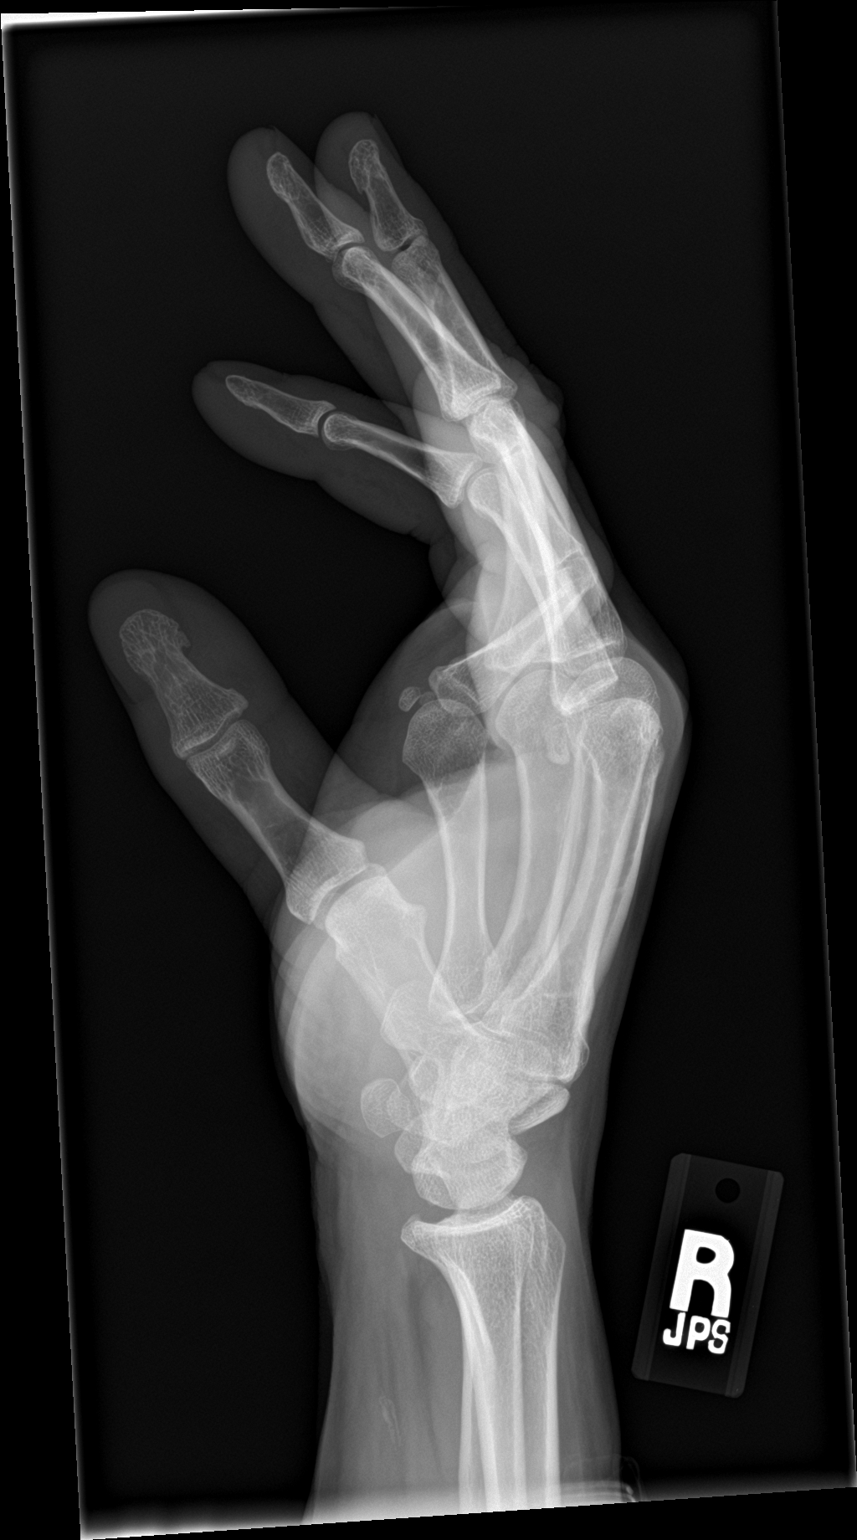

[hand lat (2 of 2)]
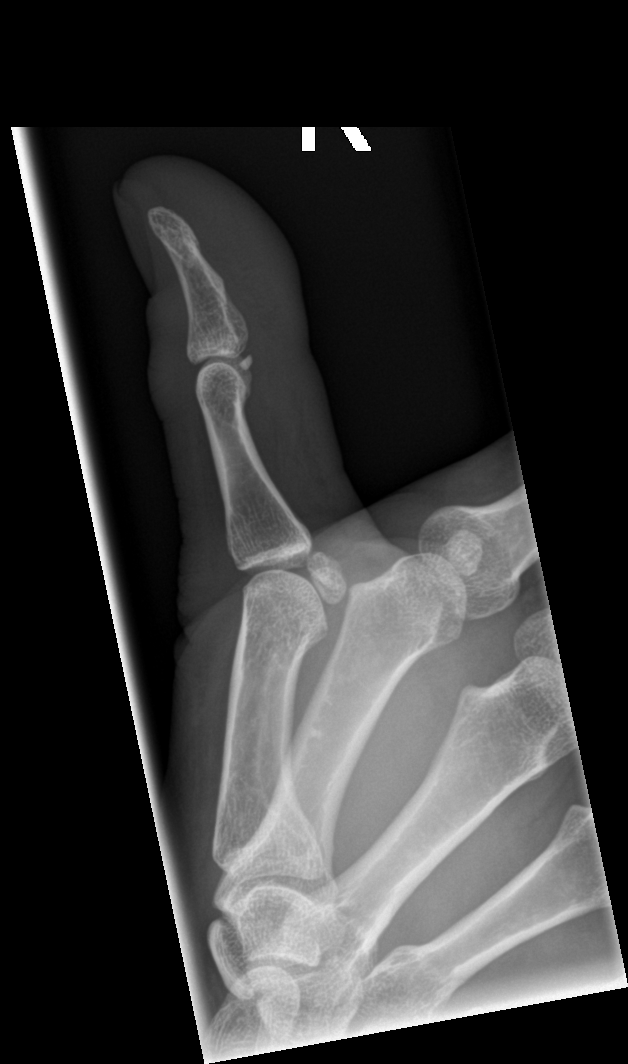

[4 of 4 positions shown; findings below may reference images not displayed]

FINDINGS: No opaque foreign body, soft tissue gas, or erosion.

Lunate triquetrum coalition.

Persisting flexion of the fifth PIP joint with middle phalanx
spurring, likely posttraumatic.
IMPRESSION: No acute finding.

## 2018-08-25 MED ORDER — AMOXICILLIN-POT CLAVULANATE 500-125 MG PO TABS: 1.0000 | ORAL_TABLET | Freq: Two times a day (BID) | ORAL | 0 refills | Status: AC

## 2018-08-25 MED ORDER — CEFTRIAXONE SODIUM 1 G IJ SOLR
1.0000 g | Freq: Once | INTRAMUSCULAR | Status: AC
  Administered 2018-08-25: 1 g via INTRAMUSCULAR

## 2018-08-25 MED ORDER — TRAMADOL HCL 50 MG PO TABS: 50.0000 mg | ORAL_TABLET | Freq: Four times a day (QID) | ORAL | 0 refills | Status: AC | PRN

## 2018-08-25 MED FILL — AMOX-CLAV 500-125 MG TABLET: 500-125 | 10 days supply | Qty: 20 | Fill #0

## 2018-08-25 MED FILL — traMADol HCL 50 MG TABS: 50 | 5 days supply | Qty: 20 | Fill #0

## 2018-08-25 NOTE — Progress Notes (Signed)
Subjective:    Patient ID: Sara Rich, female    DOB: 03/03/1976, 42 y.o.   MRN: [id number]   Due to a language barrier, a video interpreter service was used.  HPI 42 yo female who presents due to complaint of pain in her right thumb. Patient reports pain in her right thumb for the past 5 days. Patient has had no injury to her thumb of which she is aware. 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 Patient states that over the past few days her thumb has become swollen and warm to touch and the pain is constant and sharp. Pain is a 9 on a 0-10 scale and she has taken over the counter tylenol. Patient has seen no drainage from the thumb area. Patient went to work this morning but was sent home and told that she needed to see a doctor about her thumb and let the doctor tell her when she could return to work. Patient states that she cannot hold or grasp objects in her right hand due to the pain and swelling in her thumb. Patient denies any family history of gout and no family or personal history of diabetes.   Social History   Tobacco Use  . Smoking status: Never Smoker  . Smokeless tobacco: Never Used  Substance Use Topics  . Alcohol use: Never    Frequency: Never  . Drug use: Never  No Known Allergies   Review of Systems  Constitutional: Negative for chills and fever.  Respiratory: Negative for cough and shortness of breath.   Cardiovascular: Negative for chest pain and palpitations.  Gastrointestinal: Negative for abdominal pain and nausea.  Genitourinary: Negative for dysuria and frequency.  Musculoskeletal: Positive for arthralgias and joint swelling. Negative for back pain.  Neurological: Negative for dizziness, numbness and headaches.       Objective:   Physical Exam BP 134/72   Pulse 62   Temp 98.2 F (36.8 C) (Oral)   Resp 18   Ht 5\' 5"  (1.651 m)   Wt 194 lb 9.6 oz (88.3 kg)   SpO2 99%   BMI 32.38 kg/m  vital signs  and nurse's notes reviewed Gen- WNWD older female in NAD but appears to not feel well Lungs- CTA bilaterally CV- RRR ABD- soft and non-tender Musculoskeletal- patient with swelling in her right thumb and thumb and hand and wrist are warm to touch but no erythema or streaking of redness from the thumb area. Patient with abrasion versus resolving small laceration on the palmer surface near the tip of the thumb and this tip of the thumb is swollen; normal capillary refill and normal radial pulse     Assessment & Plan:  1. Infected abrasion of right thumb, initial encounter Patient with infection of the right thumb from a laceration or abrasion. Patient does not recall an initial injury. Patient given Rocephin 1 gm IM x 1 in the office and a prescription for Augmentin for treatment of infection. X-ray of hand ordered to rule out free air/gas or bone involvement. Rx for tramadol to take as needed for pain. Also encouraged patient to soak her thumb in warm water and epsom salt mixture. Go to ED if pain acutely worsens or color change to thumb or surrounding skin. Otherwise return for re-evaluation on Thursday and at that time, return to work can be discussed. - DG Hand Complete Right; Future - cefTRIAXone (ROCEPHIN) injection 1 g - amoxicillin-clavulanate (  AUGMENTIN) 500-125 MG tablet; Take 1 tablet (500 mg total) by mouth 2 (two) times daily for 10 days. Take after eating  Dispense: 20 tablet; Refill: 0 - traMADol (ULTRAM) 50 MG tablet; Take 1 tablet (50 mg total) by mouth every 6 (six) hours as needed for up to 5 days for moderate pain.  Dispense: 20 tablet; Refill: 0  2. Need for Tdap vaccination Patient works in a factory and has had a possible cut to her thumb therefore Tdap given at today's visit.  An After Visit Summary was printed and given to the patient.  Return in about 2 days (around 08/27/2018) for thumb infection.

## 2018-08-25 NOTE — Progress Notes (Signed)
Flu shot: no Pain: right thump pain:

## 2018-08-26 ENCOUNTER — Encounter: Payer: Self-pay | Admitting: Family Medicine

## 2018-08-26 ENCOUNTER — Telehealth: Payer: Self-pay

## 2018-08-26 NOTE — Telephone Encounter (Signed)
Patient was called, no answer. If the patient returns the call please inform the patient that there were no acute findings on the xray of the right thumb.

## 2018-08-26 NOTE — Telephone Encounter (Signed)
-----   Message from Cain Saupeammie Fulp, MD sent at 08/25/2018  5:17 PM EDT ----- No acute findings on xray of the right thumb

## 2018-08-27 ENCOUNTER — Encounter: Payer: Self-pay | Admitting: Family Medicine

## 2018-08-27 ENCOUNTER — Other Ambulatory Visit: Payer: Self-pay

## 2018-08-27 ENCOUNTER — Ambulatory Visit: Payer: Medicaid Other | Attending: Family Medicine | Admitting: Family Medicine

## 2018-08-27 VITALS — BP 105/75 | HR 63 | Temp 99.0°F | Resp 18 | Ht 66.0 in | Wt 192.4 lb

## 2018-08-27 DIAGNOSIS — B9689 Other specified bacterial agents as the cause of diseases classified elsewhere: Secondary | ICD-10-CM | POA: Diagnosis not present

## 2018-08-27 DIAGNOSIS — M79644 Pain in right finger(s): Secondary | ICD-10-CM | POA: Diagnosis not present

## 2018-08-27 DIAGNOSIS — L089 Local infection of the skin and subcutaneous tissue, unspecified: Secondary | ICD-10-CM | POA: Diagnosis not present

## 2018-08-27 DIAGNOSIS — X58XXXD Exposure to other specified factors, subsequent encounter: Secondary | ICD-10-CM | POA: Insufficient documentation

## 2018-08-27 DIAGNOSIS — Z09 Encounter for follow-up examination after completed treatment for conditions other than malignant neoplasm: Secondary | ICD-10-CM | POA: Diagnosis not present

## 2018-08-27 DIAGNOSIS — S60311D Abrasion of right thumb, subsequent encounter: Secondary | ICD-10-CM | POA: Insufficient documentation

## 2018-08-27 MED ORDER — CEFTRIAXONE SODIUM 1 G IJ SOLR
1.0000 g | Freq: Once | INTRAMUSCULAR | Status: AC
  Administered 2018-08-27: 1 g via INTRAMUSCULAR

## 2018-08-27 MED ORDER — ACETAMINOPHEN-CODEINE #3 300-30 MG PO TABS: 1.0000 | ORAL_TABLET | ORAL | 0 refills | Status: AC | PRN

## 2018-08-27 MED FILL — ACETAMINOPHEN/COD #3 TABLET: 300-30 | 2 days supply | Qty: 30 | Fill #0

## 2018-08-27 NOTE — Progress Notes (Signed)
Right thumb 7  [facility]

## 2018-08-27 NOTE — Progress Notes (Signed)
Subjective:    Patient ID: Sara Rich, female    DOB: December 20, 1975, 42 y.o.   MRN: [id number]   Due to a language barrier, patient is accompanied by a live interpreter at today's visit.  HPI 42 year old female who was seen in follow-up of an infection in her right thumb.  Patient reports that this pain in her thumb and that the swelling has decreased.  Patient also soak her thumb in warm water with Epson salt as suggested at her last visit.  Patient is taking the antibiotic without any difficulty or side effects.  Patient reports however that her is still painful with the pain ranging from a 5 to a 7.  Patient states that the pain medication prescribed at her last visit did not and she would like to try different pain medication.  Patient believes that she will be able to return back to work on Monday as she is able to more easily now that the swelling and pain have decreased.  Patient denies any fever or chills.  Patient no longer has pain in the rest of her hand or her wrist, just in her thumb. Social History   Tobacco Use  . Smoking status: Never Smoker  . Smokeless tobacco: Never Used  Substance Use Topics  . Alcohol use: Never    Frequency: Never  . Drug use: Never  No Known Allergies    Review of Systems  Constitutional: Positive for fatigue. Negative for chills and fever.  Respiratory: Negative for cough and shortness of breath.   Cardiovascular: Negative for chest pain, palpitations and leg swelling.  Gastrointestinal: Positive for nausea. Negative for abdominal pain.  Genitourinary: Negative for dysuria and frequency.  Musculoskeletal: Positive for arthralgias and joint swelling.  Neurological: Negative for dizziness and headaches.       Objective:   Physical Exam BP 105/75   Pulse 63   Temp 99 F (37.2 C) (Oral)   Resp 18   Ht 5\' 6"  (1.676 m)   Wt 192 lb 6.4 oz (87.3 kg)   SpO2 97%   BMI 31.05 kg/m   General-well-nourished, well-developed older female in no  acute distress sitting on the exam table Lungs-clear to auscultation bilaterally Cardiovascular-regular rate and rhythm Musculoskeletal- patient with some continued edema of the right thumb especially at the DIP joint and above.  Patient with continued tenderness to palpation though this is decreased from her last visit.  Patient with some mild warmth over the right thumb but no increased warmth or pain with palpation along the right wrist or hand      Assessment & Plan:  1. Infected abrasion of right thumb, subsequent encounter Patient still with some swelling and pain in the right patient will receive another injection of Rocephin at today's visit.  Patient is asked to continue soaking her thumb in warm water and Epson salt.  Patient's infection appears to be improving and patient feels that she will be ready to return to work on Monday and note was provided for her to return to work on Monday however if she does not feel on Monday that she is able to return to work, patient needs to return to clinic for reevaluation - cefTRIAXone (ROCEPHIN) injection 1 g  2. Thumb pain, right Patient with complaint of continued pain in her thumb did not feel that the tramadol was very helpful.  Prescription therefore provided for Tylenol 3 to take as needed for pain - acetaminophen-codeine (TYLENOL #3) 300-30 MG tablet; Take 1-2 tablets  by mouth every 4 (four) hours as needed for up to 5 days for moderate pain.  Dispense: 30 tablet; Refill: 0  An After Visit Summary was printed and given to the patient.  Return in about 1 week (around 09/03/2018) for thumb infection with PCP.

## 2018-09-03 ENCOUNTER — Ambulatory Visit: Payer: Medicaid Other | Attending: Internal Medicine | Admitting: Family Medicine

## 2018-09-03 ENCOUNTER — Encounter: Payer: Self-pay | Admitting: Family Medicine

## 2018-09-03 VITALS — BP 106/70 | HR 63 | Temp 98.5°F | Resp 16 | Wt 194.8 lb

## 2018-09-03 DIAGNOSIS — M79644 Pain in right finger(s): Secondary | ICD-10-CM | POA: Insufficient documentation

## 2018-09-03 DIAGNOSIS — S60311A Abrasion of right thumb, initial encounter: Secondary | ICD-10-CM | POA: Diagnosis present

## 2018-09-03 DIAGNOSIS — R197 Diarrhea, unspecified: Secondary | ICD-10-CM | POA: Diagnosis not present

## 2018-09-03 DIAGNOSIS — L089 Local infection of the skin and subcutaneous tissue, unspecified: Secondary | ICD-10-CM | POA: Diagnosis not present

## 2018-09-03 DIAGNOSIS — X58XXXA Exposure to other specified factors, initial encounter: Secondary | ICD-10-CM | POA: Insufficient documentation

## 2018-09-03 DIAGNOSIS — S60311D Abrasion of right thumb, subsequent encounter: Secondary | ICD-10-CM

## 2018-09-03 MED ORDER — LOPERAMIDE HCL 2 MG PO CAPS: 4.0000 mg | ORAL_CAPSULE | Freq: Once | ORAL | Status: DC

## 2018-09-03 MED ORDER — LOPERAMIDE HCL 2 MG PO TABS: 2.0000 mg | ORAL_TABLET | Freq: Four times a day (QID) | ORAL | 0 refills | Status: DC | PRN

## 2018-09-03 MED ORDER — ACETAMINOPHEN-CODEINE #3 300-30 MG PO TABS: 1.0000 | ORAL_TABLET | Freq: Four times a day (QID) | ORAL | 0 refills | Status: AC | PRN

## 2018-09-03 MED FILL — ACETAMINOPHEN/COD #3 TABLET: 300-30 | 5 days supply | Qty: 20 | Fill #0

## 2018-09-03 NOTE — Progress Notes (Signed)
Subjective:    Patient ID: Sara Rich, female    DOB: 1976-09-05, 42 y.o.   MRN: [id number]   Due to a language barrier, a video interpreter was used at today's visit  HPI 42 yo female who is seen in follow-up of an infection in the right thumb with right thumb pain. Patient states that her pain has improved and is now down to a 4 on a 0-10 pain scale. Has some increase in pain by the end of her work day. Patient reports that the only thing that hurts currently is the tip of her right thumb. Patient believes that the infection is better because now her thumb feels itchy. Patient would like to have a refill of pain medication. Patient has bene able to return to work. Patient feels that she has been able to keep up with her work duties. Patient however states that she has developed diarrhea/loose stools over the past 3 days. No abdominal pain. No blood in the stools. No fever but patient has felt as if she has had a few chills. Patient has not had an episode of diarrhea or loose stools since she left work at 4 am today but on the first day she had diarrhea 5-6 times.   PMH: history of an ovarian cyst Surgical history: no past surgeries Family History: no diabetes, hypertension All: NKDA Social: no tobacco or alcohol use  Review of Systems  Constitutional: Positive for chills. Negative for fatigue and fever.  Respiratory: Negative for cough and shortness of breath.   Cardiovascular: Negative for chest pain, palpitations and leg swelling.  Gastrointestinal: Positive for diarrhea. Negative for abdominal pain and nausea.  Genitourinary: Negative for dysuria and frequency.  Musculoskeletal: Positive for arthralgias. Negative for back pain.  Neurological: Negative for dizziness and headaches.       Objective:   Physical Exam BP 106/70   Pulse 63   Temp 98.5 F (36.9 C) (Oral)   Resp 16   Wt 194 lb 12.8 oz (88.4 kg)   SpO2 98%   BMI 31.44 kg/m  vital signs and nurses note  reviewed Gen- WNWD older female in NAD Lungs- CTA CV- RRR ABD- positive bowel sounds which are slightly hyperactive; no abdominal pain on exam Musculoskeletal: patient with some tenderness at the tip of the right thumb on the palmer side of the hand; patient with normal thumb ROM and is able to grasp/make a fist; no increased warmth and no erythema      Assessment & Plan:  1. Infected abrasion of right thumb, subsequent encounter Patient now with reduced level of pain and has been able to return to work. I will refill patient's pain medication to take as needed as she has some increased pain after work. No further antibiotic therapy at this time. Patient may continue to soak her thumb in warm water and Epsom salts as needed for any swelling or discomfort. - acetaminophen-codeine (TYLENOL #3) 300-30 MG tablet; Take 1 tablet by mouth every 6 (six) hours as needed for up to 5 days for moderate pain.  Dispense: 20 tablet; Refill: 0  2. Thumb pain, right Refill of Tylenol #3 to take as needed for moderate thumb pain - acetaminophen-codeine (TYLENOL #3) 300-30 MG tablet; Take 1 tablet by mouth every 6 (six) hours as needed for up to 5 days for moderate pain.  Dispense: 20 tablet; Refill: 0  3. Diarrhea, unspecified type Patient with recent use of antibiotics and now with diarrhea. Plan was to give  her a dose of immodium (loperamide) here in the office but medication was not available. Patient is encouraged to remain hydrated and return to clinic if there is any worsening of her diarrhea. - loperamide (IMODIUM A-D) 2 MG tablet; Take 1 tablet (2 mg total) by mouth 4 (four) times daily as needed for diarrhea or loose stools.  Dispense: 30 tablet; Refill: 0  An After Visit Summary was printed and given to the patient.  Return if symptoms worsen or fail to improve, for as needed and keep scheduled f/u with your PCP.

## 2019-12-13 ENCOUNTER — Ambulatory Visit: Payer: Medicaid Other | Attending: Critical Care Medicine | Admitting: Critical Care Medicine

## 2019-12-13 ENCOUNTER — Ambulatory Visit: Payer: Medicaid Other | Admitting: Critical Care Medicine

## 2019-12-13 ENCOUNTER — Other Ambulatory Visit: Payer: Self-pay

## 2019-12-13 ENCOUNTER — Encounter: Payer: Self-pay | Admitting: Critical Care Medicine

## 2019-12-13 VITALS — BP 125/84 | HR 61 | Resp 16 | Wt 202.6 lb

## 2019-12-13 DIAGNOSIS — M25542 Pain in joints of left hand: Secondary | ICD-10-CM

## 2019-12-13 MED ORDER — MELOXICAM 15 MG PO TABS: 15.0000 mg | ORAL_TABLET | Freq: Every day | ORAL | 0 refills | Status: DC

## 2019-12-13 MED FILL — MELOXICAM 15 MG TABLET: 15 | 30 days supply | Qty: 30 | Fill #0

## 2019-12-13 NOTE — Assessment & Plan Note (Signed)
Pain involving the index finger of the left hand with prior surgical intervention over 8 years ago with removal of one of the middle phalanx bones  Tenderness now at the base of the left hand in the palmar aspect at the junction of the meta carpal and phalanx bone of the left index finger  Plan will be to prescribe meloxicam 15 mg daily for anti-inflammatory pain relief and refer to orthopedic hand for further evaluation will defer imaging to the orthopedic surgeon

## 2019-12-13 NOTE — Patient Instructions (Signed)
Begin meloxicam 1 daily for left finger pain  A referral to our [facility] will be obtained for your chronic hand condition

## 2019-12-13 NOTE — Progress Notes (Signed)
Subjective:    Patient ID: Sara Rich, female    DOB: January 12, 1976, 44 y.o.   MRN: [id number]  This is a 44 year old female originally from [place] who speaks [place] and is seen today because of acute on chronic left index finger pain.  This condition has been in existence for 8 years and dates back when she lived in [place] and injured it on the job using her hands at work.  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  And she no longer can perform this job because of severe pain of the left hand.  There is been more pain and swelling in the palmar aspect at the base of the left index finger.  It became more swollen more recently.  The pain continues.   There is no other chronic medical problems she is currently being managed for at this time  Note the left hand is dominant   History reviewed. No pertinent past medical history.   History reviewed. No pertinent family history.   Social History   Socioeconomic History  . Marital status: Married    Spouse name: Not on file  . Number of children: 4  . Years of education: Elementary school  . Highest education level: Not on file  Occupational History  . Occupation: Works at a Licensed conveyancer  Tobacco Use  . Smoking status: Never Smoker  . Smokeless tobacco: Never Used  Substance and Sexual Activity  . Alcohol use: Never  . Drug use: Never  . Sexual activity: Not on file  Other Topics Concern  . Not on file  Social History Narrative  . Not on file   Social Determinants of Health   Financial Resource Strain:   . Difficulty of Paying Living Expenses: Not on file  Food Insecurity:   . Worried About Programme researcher, broadcasting/film/video in the Last Year: Not on file  . Ran Out of Food in the Last Year: Not on file  Transportation  Needs:   . Lack of Transportation (Medical): Not on file  . Lack of Transportation (Non-Medical): Not on file  Physical Activity:   . Days of Exercise per Week: Not on file  . Minutes of Exercise per Session: Not on file  Stress:   . Feeling of Stress : Not on file  Social Connections:   . Frequency of Communication with Friends and Family: Not on file  . Frequency of Social Gatherings with Friends and Family: Not on file  . Attends Religious Services: Not on file  . Active Member of Clubs or Organizations: Not on file  . Attends Banker Meetings: Not on file  . Marital Status: Not on file  Intimate Partner Violence:   . Fear of Current or Ex-Partner: Not on file  . Emotionally Abused: Not on file  . Physically Abused: Not on file  . Sexually Abused: Not on file     No Known Allergies   Outpatient Medications Prior to Visit  Medication Sig Dispense Refill  . ibuprofen (ADVIL,MOTRIN) 800 MG tablet Take 1 tablet (800 mg total) by mouth 3 (three) times daily. 21 tablet 0  . cyclobenzaprine (FLEXERIL) 5 MG tablet Take 1 tablet (5 mg total) by mouth daily as needed for muscle spasms. (Patient not taking: Reported on 08/25/2018)  30 tablet 1  . diclofenac sodium (VOLTAREN) 1 % GEL Apply 2 g topically 4 (four) times daily. Use for neck pain (Patient not taking: Reported on 08/25/2018) 100 g 3  . loperamide (IMODIUM A-D) 2 MG tablet Take 1 tablet (2 mg total) by mouth 4 (four) times daily as needed for diarrhea or loose stools. (Patient not taking: Reported on 12/13/2019) 30 tablet 0  . metroNIDAZOLE (FLAGYL) 500 MG tablet Take 1 tablet (500 mg total) by mouth 2 (two) times daily. One po bid x 7 days (Patient not taking: Reported on 08/03/2018) 14 tablet 0  . triamcinolone cream (KENALOG) 0.1 % Apply 1 application topically 2 (two) times daily. Use on rash on legs (Patient not taking: Reported on 08/25/2018) 30 g 0   No facility-administered medications prior to visit.      Review  of Systems Constitutional:   No  weight loss, night sweats,  Fevers, chills, fatigue, lassitude. HEENT:   No headaches,  Difficulty swallowing,  Tooth/dental problems,  Sore throat,                No sneezing, itching, ear ache, nasal congestion, post nasal drip,   CV:  No chest pain,  Orthopnea, PND, swelling in lower extremities, anasarca, dizziness, palpitations  GI  No heartburn, indigestion, abdominal pain, nausea, vomiting, diarrhea, change in bowel habits, loss of appetite  Resp: No shortness of breath with exertion or at rest.  No excess mucus, no productive cough,  No non-productive cough,  No coughing up of blood.  No change in color of mucus.  No wheezing.  No chest wall deformity  Skin: no rash or lesions.  GU: no dysuria, change in color of urine, no urgency or frequency.  No flank pain.  MS:   joint pain or swelling.  No decreased range of motion.  No back pain.  Psych:  No change in mood or affect. No depression or anxiety.  No memory loss.     Objective:   Physical Exam Vitals:   12/13/19 1407  BP: 125/84  Pulse: 61  Resp: 16  SpO2: 99%  Weight: 202 lb 9.6 oz (91.9 kg)    Gen: Pleasant, well-nourished, in no distress,  normal affect  ENT: No lesions,  mouth clear,  oropharynx clear, no postnasal drip  Neck: No JVD, no TMG, no carotid bruits  Lungs: No use of accessory muscles, no dullness to percussion, clear without rales or rhonchi  Cardiovascular: RRR, heart sounds normal, no murmur or gallops, no peripheral edema  Abdomen: soft and NT, no HSM,  BS normal  Musculoskeletal: There is seen a foreshortened left index finger with evidence of missing middle phalanx bone and pain and tenderness at the base of the left index finger in the palmar aspect No evidence of infection seen There is no real movement or use of the index finger at this time  Neuro: alert, non focal  Skin: Warm, no lesions or rashes  No imaging studies of the left hand are  available       Assessment & Plan:  I personally reviewed all images and lab data in the [facility] system as well as any outside material available during this office visit and agree with the  radiology impressions.   Pain involving joint of finger of left hand Pain involving the index finger of the left hand with prior surgical intervention over 8 years ago with removal of one of the middle phalanx bones  Tenderness now at the  base of the left hand in the palmar aspect at the junction of the meta carpal and phalanx bone of the left index finger  Plan will be to prescribe meloxicam 15 mg daily for anti-inflammatory pain relief and refer to orthopedic hand for further evaluation will defer imaging to the orthopedic surgeon   Azula was seen today for edema.  Diagnoses and all orders for this visit:  Pain involving joint of finger of left hand -     AMB referral to orthopedics  Other orders -     meloxicam (MOBIC) 15 MG tablet; Take 1 tablet (15 mg total) by mouth daily.

## 2019-12-21 ENCOUNTER — Ambulatory Visit (INDEPENDENT_AMBULATORY_CARE_PROVIDER_SITE_OTHER): Payer: Medicaid Other | Admitting: Orthopaedic Surgery

## 2019-12-21 ENCOUNTER — Other Ambulatory Visit: Payer: Self-pay

## 2019-12-21 ENCOUNTER — Ambulatory Visit (INDEPENDENT_AMBULATORY_CARE_PROVIDER_SITE_OTHER): Payer: Medicaid Other

## 2019-12-21 ENCOUNTER — Encounter: Payer: Self-pay | Admitting: Orthopaedic Surgery

## 2019-12-21 DIAGNOSIS — M25542 Pain in joints of left hand: Secondary | ICD-10-CM | POA: Diagnosis not present

## 2019-12-21 MED ORDER — ACETAZOLAMIDE 125 MG PO TABS: 125.0000 mg | ORAL_TABLET | Freq: Two times a day (BID) | ORAL | 0 refills | Status: DC | PRN

## 2019-12-21 NOTE — Progress Notes (Addendum)
   Office Visit Note   Patient: Sara Rich           Date of Birth: 05/13/1976           MRN: [id number] Visit Date: 12/21/2019              Requested by: [name], MD [address] [place],  [place] [address] PCP: Marcine Matar, MD   Assessment & Plan: Visit Diagnoses:  1. 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  We will order an MRI with contrast to further assess this.  She will follow-up with [place] once that has been completed.  This was discussed with her interpreter today.  Follow-Up Instructions: Return for after mri.   Orders:  Orders Placed This Encounter  Procedures  . XR Hand Complete Left  . MR HAND LEFT W CONTRAST   Meds ordered this encounter  Medications  . DISCONTD: acetaZOLAMIDE (DIAMOX) 125 MG tablet    Sig: Take 1 tablet (125 mg total) by mouth 2 (two) times daily as needed.    Dispense:  12 tablet    Refill:  0      Procedures: No procedures performed   Clinical Data: No additional findings.   Subjective: Chief Complaint  Patient presents with  . Left Hand - Pain    HPI patient is a pleasant 44 year old female who comes in today with an interpreter.  She is here with left index finger pain and swelling.  She notes that approximately 8 years ago she started having pain and swelling to that same finger which was subsequently operated on in her native country of [place].  She notes that this was due to an infection.  She also notes that she has had pain since.  Majority of her pain was to the volar aspect of the proximal phalanx.  This is tender to the touch.  She denies any fevers or chills.  Review of Systems as detailed in HPI.  All others reviewed and are negative.   Objective: Vital Signs: There were no vitals taken for this visit.  Physical Exam well-developed well-nourished female no acute distress.  Alert and oriented  x3.  Ortho Exam examination of her left hand reveals moderate tenderness to the dorsum of the proximal phalanx.  There is mild swelling.  No skin changes.  She does have contractures at the MCP and DIP joints.  She is neurovascularly intact distally.  Specialty Comments:  No specialty comments available.  Imaging: No results found.   PMFS History: Patient Active Problem List   Diagnosis Date Noted  . Pain involving joint of finger of left hand 12/13/2019  . Neck pain, musculoskeletal 08/03/2018  . Cyst of left ovary 08/03/2018   No past medical history on file.  No family history on file.  No past surgical history on file. Social History   Occupational History  . Occupation: Works at a Licensed conveyancer  Tobacco Use  . Smoking status: Never Smoker  . Smokeless tobacco: Never Used  Substance and Sexual Activity  . Alcohol use: Never  . Drug use: Never  . Sexual activity: Not on file

## 2019-12-22 NOTE — Addendum Note (Signed)
Addended by: Mayra Reel on: 12/22/2019 01:06 PM   Modules accepted: Orders

## 2020-01-05 ENCOUNTER — Other Ambulatory Visit: Payer: Self-pay | Admitting: Physician Assistant

## 2020-01-05 DIAGNOSIS — M25542 Pain in joints of left hand: Secondary | ICD-10-CM

## 2020-01-18 ENCOUNTER — Encounter: Payer: Self-pay | Admitting: *Deleted

## 2020-08-16 ENCOUNTER — Ambulatory Visit: Payer: Self-pay | Attending: Family Medicine | Admitting: Family Medicine

## 2020-08-16 ENCOUNTER — Encounter: Payer: Self-pay | Admitting: Family Medicine

## 2020-08-16 ENCOUNTER — Other Ambulatory Visit: Payer: Self-pay

## 2020-08-16 VITALS — BP 104/68 | HR 68 | Temp 98.2°F | Resp 18 | Wt 199.0 lb

## 2020-08-16 DIAGNOSIS — M79645 Pain in left finger(s): Secondary | ICD-10-CM | POA: Insufficient documentation

## 2020-08-16 DIAGNOSIS — Z758 Other problems related to medical facilities and other health care: Secondary | ICD-10-CM

## 2020-08-16 DIAGNOSIS — M25511 Pain in right shoulder: Secondary | ICD-10-CM | POA: Insufficient documentation

## 2020-08-16 DIAGNOSIS — Z603 Acculturation difficulty: Secondary | ICD-10-CM

## 2020-08-16 DIAGNOSIS — Z789 Other specified health status: Secondary | ICD-10-CM

## 2020-08-16 DIAGNOSIS — R202 Paresthesia of skin: Secondary | ICD-10-CM | POA: Insufficient documentation

## 2020-08-16 MED ORDER — NAPROXEN 500 MG PO TABS: ORAL_TABLET | ORAL | 3 refills | Status: DC

## 2020-08-16 NOTE — Progress Notes (Signed)
New Patient Office Visit  Subjective:  Patient ID: Sara Rich, female    DOB: [birth year]  Age: 44 y.o. 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  This has caused her to develop pain in her right shoulder along with a sensation of numbness on the right side of the neck down the right arm to the hand as well as flareup of pain in her left index finger.  Pain is a 8 on a 0-to-10 scale.  Pain is often sharp.  She has tried no over-the-counter pain medications.  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  She reports that she had surgery on the left index finger about 8 years ago after developing an infection in that finger.  She denies any other surgeries other than removal of metal from the area above the right eye near the eyebrow.  She reports that metal got into this area after she fell.  Patient is originally from [place] but fled to a refugee camp and was rescheduled to the [place] prior to coming to this country.  Patient has 4 children.  She does not smoke.  She does not have any other significant medical history.  She denies any prediabetes/gestational diabetes.  Past Medical History:  Diagnosis Date  . Known health problems: none     Past Surgical History:  Procedure Laterality Date  . surgery on left index finger      Family History  Problem Relation Age of Onset  . Diabetes Neg Hx   . Hypertension Neg Hx   . Cancer Neg Hx     Social History   Socioeconomic History  . Marital status: Married    Spouse name: Not on file  .  Number of children: Not on file  . Years of education: Not on file  . Highest education level: Not on file  Occupational History  . Not on file  Tobacco Use  . Smoking status: Never Smoker  . Smokeless tobacco: Never Used  Substance and Sexual Activity  . Alcohol use: Not on file  . Drug use: Not on file  . Sexual activity: Not on file  Other Topics Concern  . Not on file  Social History Narrative  . Not on file   Social Determinants of Health   Financial Resource Strain:   . Difficulty of Paying Living Expenses: Not on file  Food Insecurity:   . Worried About Programme researcher, broadcasting/film/video in the Last Year: Not on file  . Ran Out of Food in the Last Year: Not on file  Transportation Needs:   . Lack of Transportation (Medical): Not on file  . Lack of Transportation (Non-Medical): Not on file  Physical Activity:   . Days of Exercise per Week: Not on file  . Minutes of Exercise per Session: Not on file  Stress:   . Feeling of Stress : Not on file  Social Connections:   . Frequency of Communication with Friends and Family: Not on file  . Frequency of Social Gatherings with  Friends and Family: Not on file  . Attends Religious Services: Not on file  . Active Member of Clubs or Organizations: Not on file  . Attends Banker Meetings: Not on file  . Marital Status: Not on file  Intimate Partner Violence:   . Fear of Current or Ex-Partner: Not on file  . Emotionally Abused: Not on file  . Physically Abused: Not on file  . Sexually Abused: Not on file    ROS Review of Systems  Constitutional: Positive for fatigue. Negative for chills and fever.  HENT: Negative for sore throat and trouble swallowing.   Respiratory: Negative for cough and shortness of breath.   Cardiovascular: Negative for chest pain and palpitations.  Gastrointestinal: Negative for abdominal pain, constipation, diarrhea and nausea.  Endocrine: Negative for polydipsia, polyphagia and polyuria.   Genitourinary: Negative for dysuria and frequency.  Musculoskeletal: Positive for arthralgias and neck pain. Negative for back pain.  Neurological: Positive for numbness. Negative for dizziness and headaches.  Hematological: Negative for adenopathy. Does not bruise/bleed easily.  Psychiatric/Behavioral: Negative for suicidal ideas. The patient is not nervous/anxious.     Objective:   Today's Vitals: BP 104/68   Pulse 68   Temp 98.2 F (36.8 C)   Resp 18   Wt 199 lb (90.3 kg)   SpO2 98%   Physical Exam Vitals and nursing note reviewed. Chaperone present: Accompanied by [place] interpreter.  Constitutional:      Appearance: Normal appearance. She is obese.  Cardiovascular:     Rate and Rhythm: Normal rate and regular rhythm.  Pulmonary:     Effort: Pulmonary effort is normal.     Breath sounds: Normal breath sounds.  Abdominal:     Palpations: Abdomen is soft.     Tenderness: There is no abdominal tenderness. There is no guarding or rebound.  Musculoskeletal:        General: Tenderness (Positive empty can sign at the right shoulder; mild right neck/trapezius spasm; tenderness to palpation along area of deformity of left index finger) and deformity (Deformity at the DIP joint of the left index finger with atrophy of the area and tenderness to palpation; palmar contraction at the DIP joint of the left index finger) present.     Right lower leg: No edema.     Left lower leg: No edema.     Comments: Negative Tinel at right wrist; patient repeatedly opens and closes the right hand and shakes her right hand during her visit  Skin:    General: Skin is warm and dry.  Neurological:     Mental Status: She is alert.  Psychiatric:        Mood and Affect: Mood normal.        Behavior: Behavior normal.     Assessment & Plan:  1. Acute pain of right shoulder; 2.  Paresthesia of right upper extremity She reports recent acute onset of pain and numbness in the right shoulder/ right  upper extremity since since she was moved to a position requiring repetitive overhead lifting.  Based on exam suspect right shoulder tendinopathy as well as possible cervical radiculopathy.  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  She is also being referred to orthopedics for further evaluation and treatment and prescription provided for naproxen to take twice daily after a meal as needed for pain. - AMB referral to orthopedics -  naproxen (NAPROSYN) 500 MG tablet; One tablet twice per day after a meal as needed for pain  Dispense: 60 tablet; Refill: 3  3. Pain of finger of left hand Patient with pain, decreased range of motion and deformity of the left index finger status post surgery.  Patient reports that the pain had resolved however restarted after being switched to a work position that requires her to repeatedly lift objects overhead.  Patient will be referred to hand surgeon for further evaluation and treatment. - Ambulatory referral to Hand Surgery - naproxen (NAPROSYN) 500 MG tablet; One tablet twice per day after a meal as needed for pain  Dispense: 60 tablet; Refill: 3   Outpatient Encounter Medications as of 08/16/2020  Medication Sig  . naproxen (NAPROSYN) 500 MG tablet One tablet twice per day after a meal as needed for pain   No facility-administered encounter medications on file as of 08/16/2020.    Follow-up: Return for Shoulder/hand pain-as needed and schedule well exam.   Cain Saupe, MD

## 2020-08-16 NOTE — Progress Notes (Signed)
c /o L point finger pain X 8 yrs ago / pt had surgery to the finger but pain has never stopped

## 2020-08-21 ENCOUNTER — Telehealth: Payer: Self-pay

## 2020-08-21 NOTE — Telephone Encounter (Signed)
More information needed on letter dated 08/16/20 which [employer] wrote on for pt employer to allow pt to return to work. 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 Also requesting specific end date to the limitations. Placed [employer] letter & copy of [employer] original letter on [employer].  Call pt [phone]. Let pt know if OK to pick up at 11:00 tomorrow 08/22/20 or if not, please write the note to include being out of work 08/22/20 also. Pt is scheduled to work at 2:00 pm tomorrow. Copies placed in [employer] box.

## 2020-08-22 ENCOUNTER — Encounter: Payer: Self-pay | Admitting: Family Medicine

## 2020-08-22 NOTE — Telephone Encounter (Signed)
Spoke with pt husband. Pt husband came and picked up work note

## 2020-08-22 NOTE — Progress Notes (Signed)
Patient ID: Sara Rich, female   DOB: 1976-10-14, 44 y.o.   MRN: [id number]   See telephone encounters-patient needs additional information for her work note given her last visit per her employer, Sara Rich fax number [phone],  Including specific limitiations on lifting and how long team member can work and the specific end date to the the limitations, that the weeks, months regarding permanent limitations.  Once restrictions are noted, she will also evaluate and make appropriate placement.

## 2020-08-22 NOTE — Telephone Encounter (Signed)
Please contact patient and let her know that her letter will be faxed to her employer and will cover her work absence for today as well if needed. She will need a [place] interpreter when contacted. Thank you.

## 2020-12-01 ENCOUNTER — Emergency Department (HOSPITAL_COMMUNITY): Payer: BLUE CROSS/BLUE SHIELD

## 2020-12-01 ENCOUNTER — Encounter (HOSPITAL_COMMUNITY): Payer: Self-pay | Admitting: Emergency Medicine

## 2020-12-01 ENCOUNTER — Other Ambulatory Visit: Payer: Self-pay

## 2020-12-01 ENCOUNTER — Emergency Department (HOSPITAL_COMMUNITY)
Admission: EM | Admit: 2020-12-01 | Discharge: 2020-12-01 | Disposition: A | Discharge status: Home / Self Care | Payer: BLUE CROSS/BLUE SHIELD | Attending: Emergency Medicine | Admitting: Emergency Medicine

## 2020-12-01 DIAGNOSIS — Z20822 Contact with and (suspected) exposure to covid-19: Secondary | ICD-10-CM | POA: Diagnosis not present

## 2020-12-01 DIAGNOSIS — M542 Cervicalgia: Secondary | ICD-10-CM | POA: Diagnosis not present

## 2020-12-01 DIAGNOSIS — M791 Myalgia, unspecified site: Secondary | ICD-10-CM | POA: Diagnosis not present

## 2020-12-01 DIAGNOSIS — R52 Pain, unspecified: Secondary | ICD-10-CM | POA: Diagnosis present

## 2020-12-01 LAB — COMPREHENSIVE METABOLIC PANEL
ALT: 15 U/L (ref 0–44)
AST: 21 U/L (ref 15–41)
Albumin: 3.6 g/dL (ref 3.5–5.0)
Alkaline Phosphatase: 62 U/L (ref 38–126)
Anion gap: 10 (ref 5–15)
BUN: 7 mg/dL (ref 6–20)
CO2: 23 mmol/L (ref 22–32)
Calcium: 9.2 mg/dL (ref 8.9–10.3)
Chloride: 106 mmol/L (ref 98–111)
Creatinine, Ser: 0.73 mg/dL (ref 0.44–1.00)
GFR, Estimated: >60 mL/min (ref >60)
Glucose, Bld: 89 mg/dL (ref 70–99)
Potassium: 3.6 mmol/L (ref 3.5–5.1)
Sodium: 139 mmol/L (ref 135–145)
Total Bilirubin: 0.6 mg/dL (ref 0.3–1.2)
Total Protein: 6.6 g/dL (ref 6.5–8.1)

## 2020-12-01 LAB — RESP PANEL BY RT-PCR (FLU A&B, COVID) ARPGX2
Influenza A by PCR: NEGATIVE
Influenza B by PCR: NEGATIVE
SARS Coronavirus 2 by RT PCR: NEGATIVE

## 2020-12-01 LAB — CBC WITH DIFFERENTIAL/PLATELET
Abs Immature Granulocytes: 0.04 10*3/uL (ref 0.00–0.07)
Basophils Absolute: 0.1 10*3/uL (ref 0.0–0.1)
Basophils Relative: 1 %
Eosinophils Absolute: 0.1 10*3/uL (ref 0.0–0.5)
Eosinophils Relative: 1 %
HCT: 38.7 % (ref 36.0–46.0)
Hemoglobin: 12.1 g/dL (ref 12.0–15.0)
Immature Granulocytes: 1 %
Lymphocytes Relative: 32 %
Lymphs Abs: 2 10*3/uL (ref 0.7–4.0)
MCH: 27.6 pg (ref 26.0–34.0)
MCHC: 31.3 g/dL (ref 30.0–36.0)
MCV: 88.2 fL (ref 80.0–100.0)
Monocytes Absolute: 0.7 10*3/uL (ref 0.1–1.0)
Monocytes Relative: 11 %
Neutro Abs: 3.3 10*3/uL (ref 1.7–7.7)
Neutrophils Relative %: 54 %
Platelets: 268 10*3/uL (ref 150–400)
RBC: 4.39 MIL/uL (ref 3.87–5.11)
RDW: 13.8 % (ref 11.5–15.5)
WBC: 6.2 10*3/uL (ref 4.0–10.5)
nRBC: 0 % (ref 0.0–0.2)

## 2020-12-01 LAB — CK: Total CK: 184 U/L (ref 38–234)

## 2020-12-01 IMAGING — DX DG CHEST 1V PORT
1 series · 1 of 1 positions shown · non-contrast
Comparison: None.

CLINICAL DATA: Body aches since yesterday.

EXAM:
PORTABLE CHEST 1 VIEW

[chest]
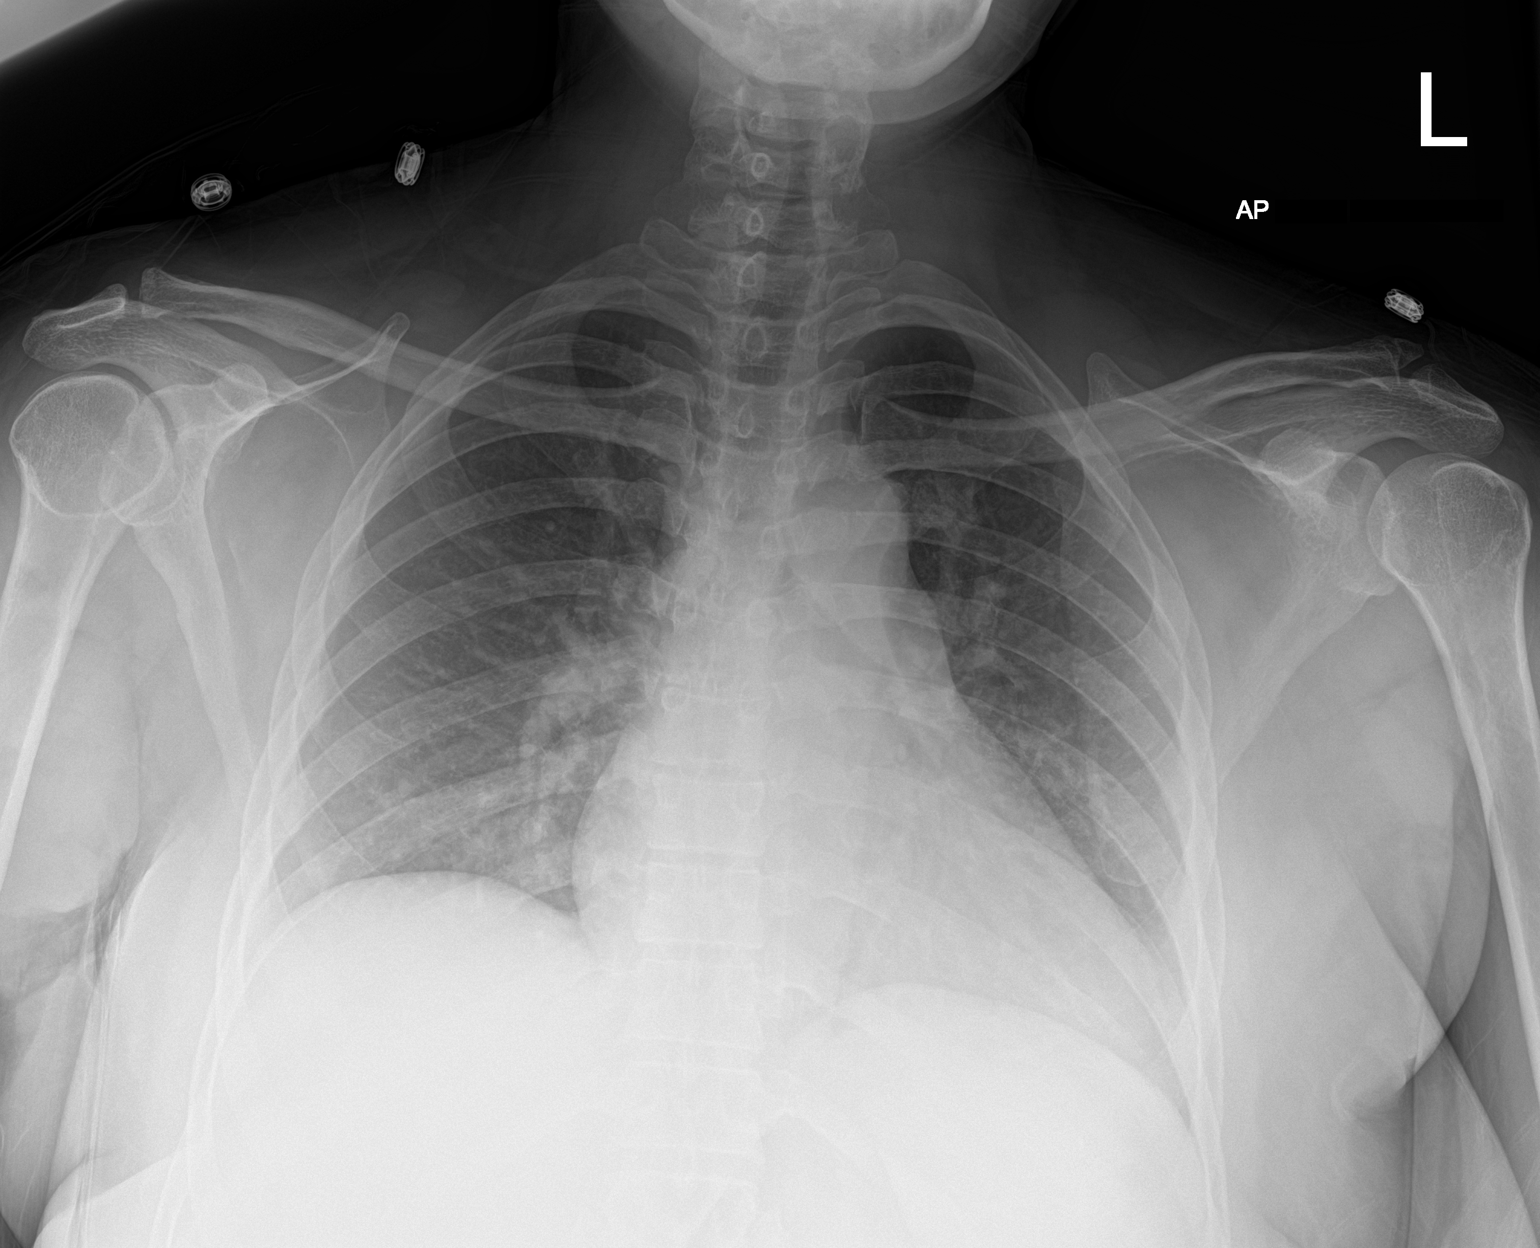

[1 of 1 positions shown; findings below may reference images not displayed]

FINDINGS: Lungs are hypoinflated without consolidation or effusion.
Cardiomediastinal silhouette, bones and soft tissues are
unremarkable.
IMPRESSION: Hypoinflation without acute cardiopulmonary disease.

## 2020-12-01 IMAGING — MR MR CERVICAL SPINE WO/W CM
4 of 8 series · 19 of 48 positions shown · IV contrast (gadavist)
Comparison: None.

CLINICAL DATA: Neck pain, acute, infection suspected.

EXAM:
MRI CERVICAL SPINE WITHOUT AND WITH CONTRAST
TECHNIQUE: Multiplanar and multiecho pulse sequences of the cervical spine, to
include the craniocervical junction and cervicothoracic junction,
were obtained without and with intravenous contrast.
CONTRAST:  9mL GADAVIST GADOBUTROL 1 MMOL/ML IV SOLN

[Series 2: T2 · sagittal · 3.0mm · 0.35mm/px · 4 of 18 slices shown (1 of 2)]
[im 1/18]
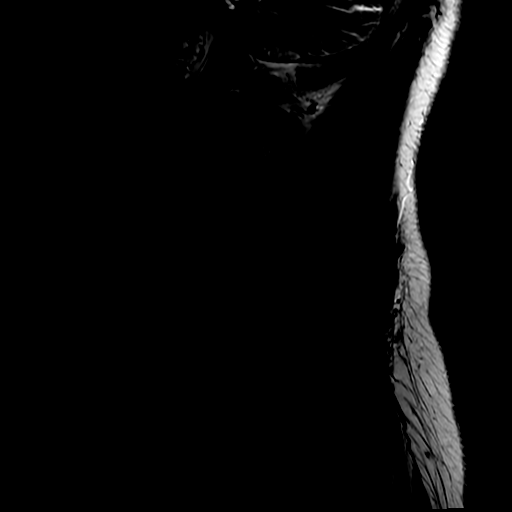
[im 6/18]
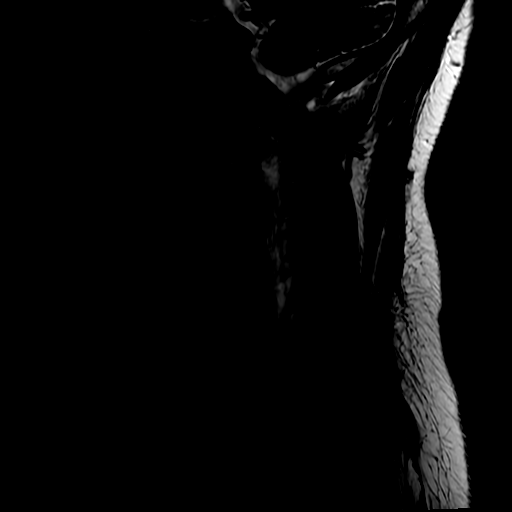
[im 12/18]
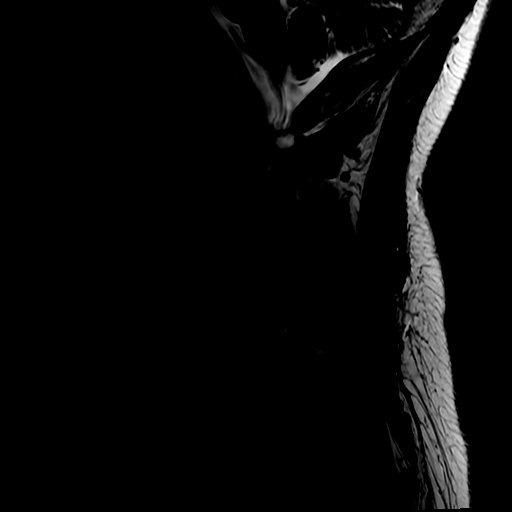
[im 18/18]
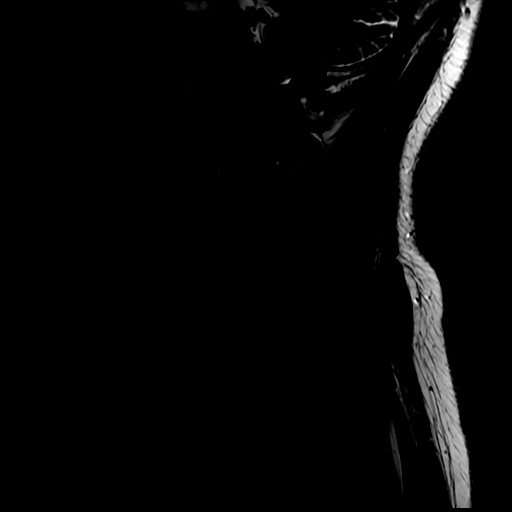

[Series 6: T2 · axial · 3.0mm · 0.35mm/px · z∈[-84,+25]mm · 6 of 37 slices shown (2 of 2)]
[im 1/37]
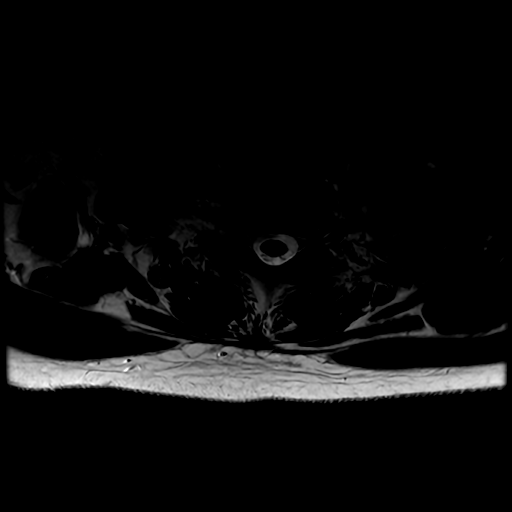
[im 8/37]
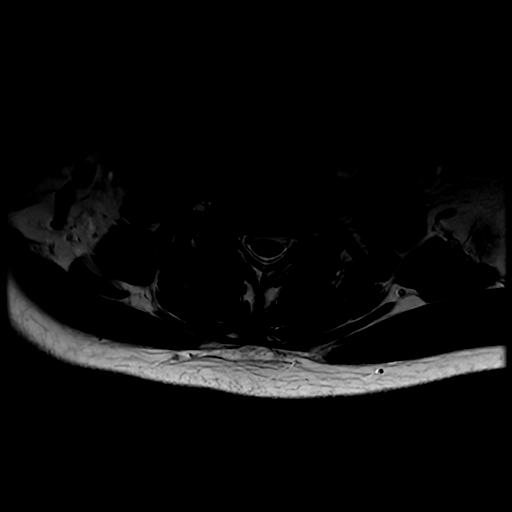
[im 15/37]
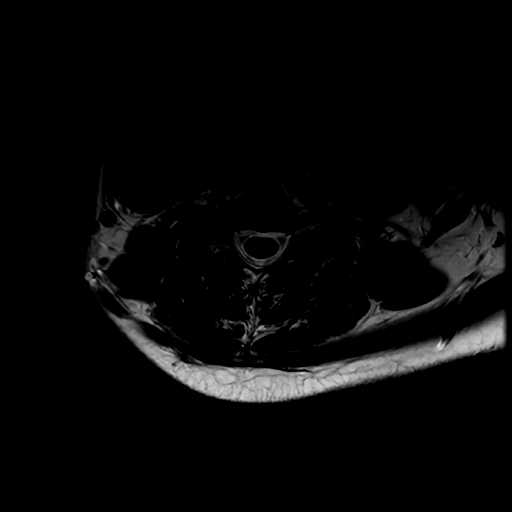
[im 22/37]
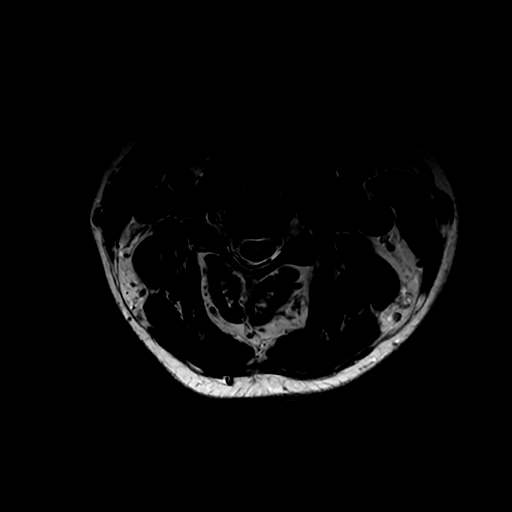
[im 29/37]
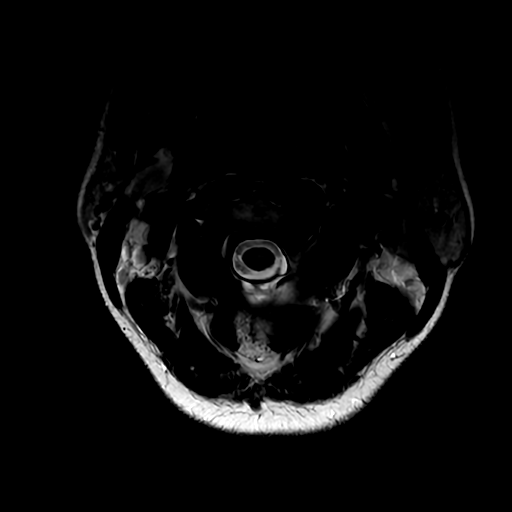
[im 37/37]
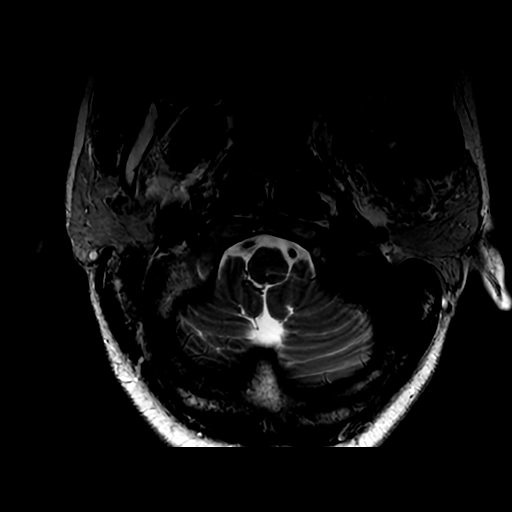

[Series 7: T1 · axial · non-contrast · 3.0mm · 0.35mm/px · z∈[-84,+25]mm · 6 of 37 slices shown]
[im 1/37]
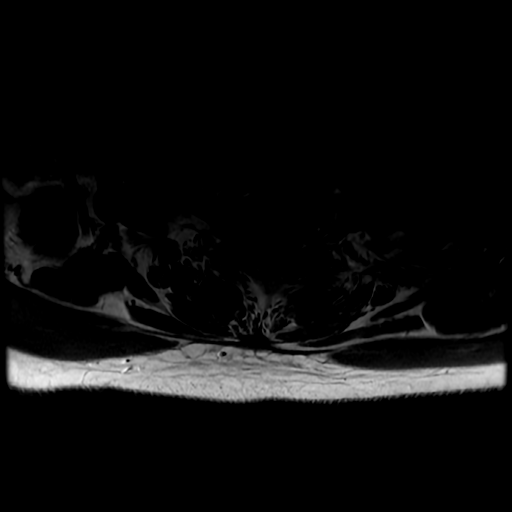
[im 8/37]
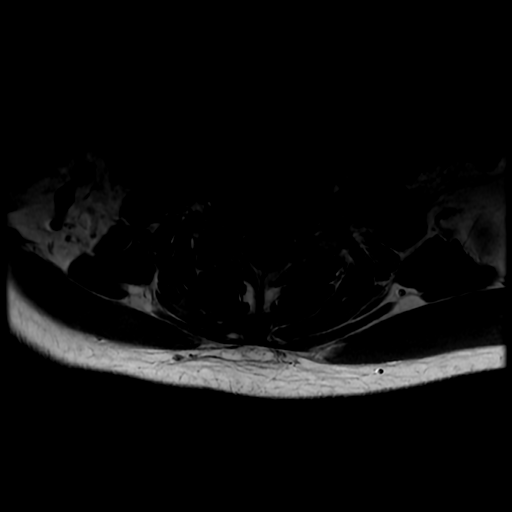
[im 15/37]
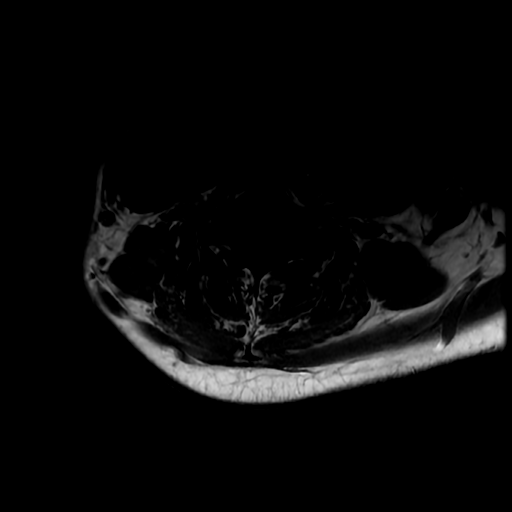
[im 22/37]
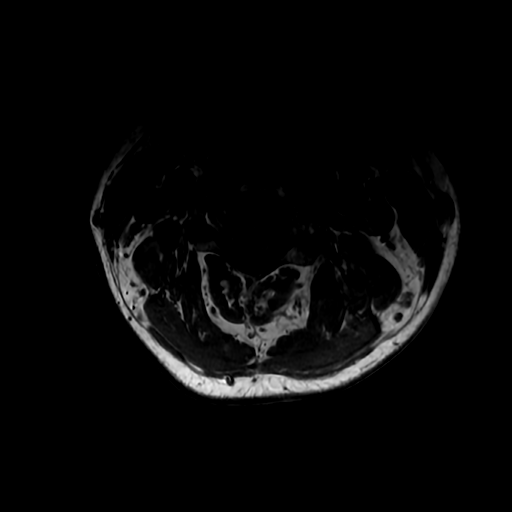
[im 29/37]
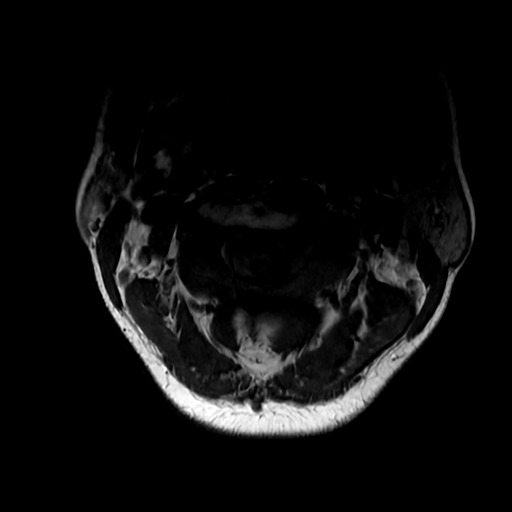
[im 37/37]
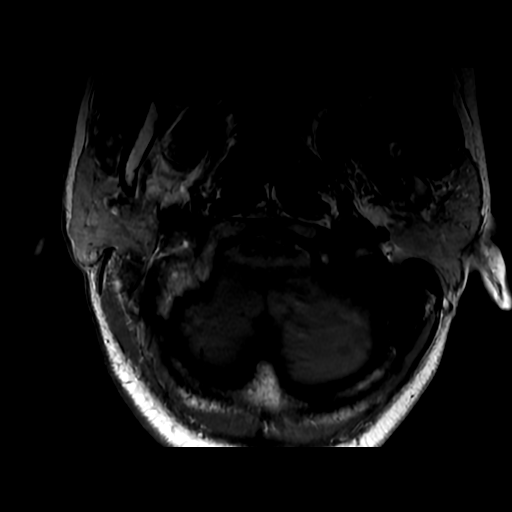

[Series 8: T1 fat-sat post-contrast · sagittal · 3.0mm · 0.35mm/px · 3 of 18 slices shown]
[im 1/18]
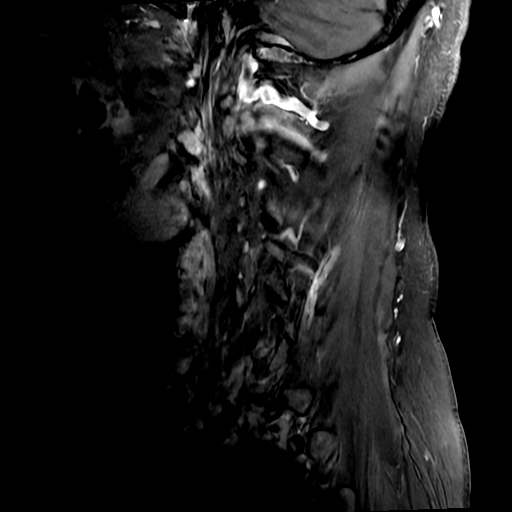
[im 9/18]
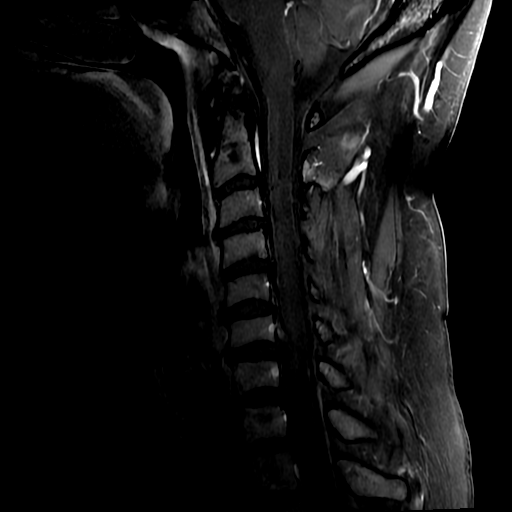
[im 18/18]
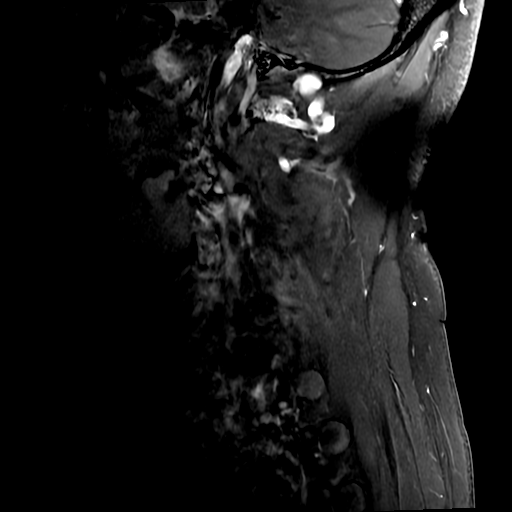

[19 of 48 positions shown; findings below may reference images not displayed]

FINDINGS: Alignment: Cervical spine straightening.  No listhesis.

Vertebrae: No fracture, suspicious osseous lesion, significant
marrow edema, or evidence of discitis.

Cord: Normal cord signal.  No abnormal intradural enhancement.

Posterior Fossa, vertebral arteries, paraspinal tissues:
Unremarkable.

Disc levels:

C2-3: Negative.

C3-4: Mild disc bulging and mild uncovertebral spurring result in
mild right greater than left neural foraminal stenosis without
spinal stenosis.

C4-5: Mild disc bulging results in mild left neural foraminal
stenosis without spinal stenosis.

C5-6: Minimal disc bulging without stenosis.

C6-7: Disc bulging and a superimposed central to left central disc
protrusion result in mild spinal stenosis without significant neural
foraminal stenosis.

C7-T1: Negative.
IMPRESSION: 1. No evidence of infection or other acute osseous abnormality in
the cervical spine.
2. Mild cervical spondylosis with mild spinal stenosis at C6-7.

## 2020-12-01 MED ORDER — KETOROLAC TROMETHAMINE 60 MG/2ML IM SOLN
60.0000 mg | Freq: Once | INTRAMUSCULAR | Status: AC
  Administered 2020-12-01: 60 mg via INTRAMUSCULAR
  Filled 2020-12-01: qty 2

## 2020-12-01 MED ORDER — IBUPROFEN 800 MG PO TABS: 800.0000 mg | ORAL_TABLET | Freq: Three times a day (TID) | ORAL | 0 refills | Status: DC

## 2020-12-01 MED ORDER — GADOBUTROL 1 MMOL/ML IV SOLN
9.0000 mL | Freq: Once | INTRAVENOUS | Status: AC | PRN
  Administered 2020-12-01: 9 mL via INTRAVENOUS

## 2020-12-01 NOTE — Discharge Instructions (Signed)
Votre bilan d'aujourd'hui a t globalement rassurant. Il n'y a aucun signe d'infection, votre IRM n'a montr aucune infection ni aucune cause potentiellement mortelle de [employer] cou. Veuillez prendre l'ibuprofne prescrit au besoin. Steele Sizer faire un suivi avec votre mdecin de soins primaires si vos symptmes ne s'amliorent pas. Votre test Covid d'aujourd'hui tait ngatif. Retournez aux urgences pour tout symptme nouveau ou s'aggravant.     Your work-up today was overall reassuring.  There is no signs of infection, your MRI did not show any infection or any life-threatening causes of your neck pain.  Please take the prescribed ibuprofen as needed.  Please follow-up with your primary care doctor if your symptoms do not improve.  Your Covid test today was negative.  Return to the ER for any new or worsening symptoms.

## 2020-12-01 NOTE — ED Notes (Signed)
Discharge instructions discussed with pt. Pt verbalized understanding. Pt stable and ambulatory. Discharge instructions reviewed using interpretor. No signature pad available

## 2020-12-01 NOTE — ED Provider Notes (Signed)
[facility] EMERGENCY DEPARTMENT Provider Note   CSN: [id number] Arrival date & time: 12/01/20  1210     History Chief Complaint  Patient presents with  . Generalized Body Aches    Sara Rich is a 44 y.o. female.  HPI 44 year old female with complaints of generalized body aches and neck pain since Monday.  History provided via a [place] interpreter. Pt is a difficult historian. She reports body aches all over, worse in her neck.  She states that she has a history of chronic neck pain but over the last few days it has gotten significantly worse.  She is feels like she is "to being paralyzed".  She denies any fevers, nasal congestion, cough, abdominal pain, nausea, vomiting, dysuria, diarrhea or any other infectious symptoms.  She is vaccinated for Covid.  She denies any history of IV drug use.  Feels like both of her arms are going more numb.  She endorses some mild left stiffness but does feel like she is able to move her neck still.  This is not more than her baseline.    History reviewed. No pertinent past medical history.  Patient Active Problem List   Diagnosis Date Noted  . Pain involving joint of finger of left hand 12/13/2019  . Neck pain, musculoskeletal 08/03/2018  . Cyst of left ovary 08/03/2018    History reviewed. No pertinent surgical history.   OB History   No obstetric history on file.     History reviewed. No pertinent family history.  Social History   Tobacco Use  . Smoking status: Never Smoker  . Smokeless tobacco: Never Used  Vaping Use  . Vaping Use: Never used  Substance Use Topics  . Alcohol use: Never  . Drug use: Never    Home Medications Prior to Admission medications   Medication Sig Start Date End Date Taking? Authorizing Provider  ibuprofen (ADVIL) 800 MG tablet Take 1 tablet (800 mg total) by mouth 3 (three) times daily. 12/01/20   Mare Ferrari, PA-C    Allergies    Patient has no known  allergies.  Review of Systems   Review of Systems  Constitutional: Negative for chills and fever.  HENT: Negative for ear pain and sore throat.   Eyes: Negative for pain and visual disturbance.  Respiratory: Negative for cough and shortness of breath.   Cardiovascular: Negative for chest pain and palpitations.  Gastrointestinal: Negative for abdominal pain and vomiting.  Genitourinary: Negative for dysuria and hematuria.  Musculoskeletal: Positive for myalgias and neck pain. Negative for arthralgias, back pain and neck stiffness.  Skin: Negative for color change and rash.  Neurological: Negative for seizures and syncope.  All other systems reviewed and are negative.   Physical Exam Updated Vital Signs BP 127/79   Pulse (!) 48   Temp 98.5 F (36.9 C) (Oral)   Resp 18   SpO2 98%   Physical Exam Vitals and nursing note reviewed.  Constitutional:      General: She is not in acute distress.    Appearance: She is well-developed and well-nourished. She is not ill-appearing, toxic-appearing or diaphoretic.  HENT:     Head: Normocephalic and atraumatic.  Eyes:     Conjunctiva/sclera: Conjunctivae normal.  Cardiovascular:     Rate and Rhythm: Normal rate and regular rhythm.     Pulses: Normal pulses.     Heart sounds: Normal heart sounds. No murmur heard.   Pulmonary:     Effort: Pulmonary effort is normal.  No respiratory distress.     Breath sounds: Normal breath sounds.  Abdominal:     Palpations: Abdomen is soft.     Tenderness: There is no abdominal tenderness.     Comments: Abdomen is soft and nontender  Musculoskeletal:        General: Tenderness present. No deformity or edema.     Cervical back: Neck supple.     Comments: Midline tenderness to the C-spine with associated paraspinal muscle tenderness.  5/5 strength in upper extremities, gross sensations intact.  2+ radial pulses bilaterally.  Visual exam with no overlying skin changes, no erythema, no warmth to the  C-spine.  No T or L-spine tenderness.  Skin:    General: Skin is warm and dry.     Capillary Refill: Capillary refill takes less than 2 seconds.  Neurological:     General: No focal deficit present.     Mental Status: She is alert and oriented to person, place, and time.     Sensory: No sensory deficit.     Motor: No weakness.  Psychiatric:        Mood and Affect: Mood and affect normal.      ED Results / Procedures / Treatments   Labs (all labs ordered are listed, but only abnormal results are displayed) Labs Reviewed  RESP PANEL BY RT-PCR (FLU A&B, COVID) ARPGX2  CBC WITH DIFFERENTIAL/PLATELET  COMPREHENSIVE METABOLIC PANEL  CK  URINALYSIS, ROUTINE W REFLEX MICROSCOPIC    EKG None  Radiology MR Cervical Spine W or Wo Contrast  Result Date: 12/01/2020 CLINICAL DATA:  Neck pain, acute, infection suspected. EXAM: MRI CERVICAL SPINE WITHOUT AND WITH CONTRAST TECHNIQUE: Multiplanar and multiecho pulse sequences of the cervical spine, to include the craniocervical junction and cervicothoracic junction, were obtained without and with intravenous contrast. CONTRAST:  9mL GADAVIST GADOBUTROL 1 MMOL/ML IV SOLN COMPARISON:  None. FINDINGS: Alignment: Cervical spine straightening.  No listhesis. Vertebrae: No fracture, suspicious osseous lesion, significant marrow edema, or evidence of discitis. Cord: Normal cord signal.  No abnormal intradural enhancement. Posterior Fossa, vertebral arteries, paraspinal tissues: Unremarkable. Disc levels: C2-3: Negative. C3-4: Mild disc bulging and mild uncovertebral spurring result in mild right greater than left neural foraminal stenosis without spinal stenosis. C4-5: Mild disc bulging results in mild left neural foraminal stenosis without spinal stenosis. C5-6: Minimal disc bulging without stenosis. C6-7: Disc bulging and a superimposed central to left central disc protrusion result in mild spinal stenosis without significant neural foraminal stenosis.  C7-T1: Negative. IMPRESSION: 1. No evidence of infection or other acute osseous abnormality in the cervical spine. 2. Mild cervical spondylosis with mild spinal stenosis at C6-7. Electronically Signed   By: Sebastian Ache M.D.   On: 12/01/2020 15:54   DG Chest Portable 1 View  Result Date: 12/01/2020 CLINICAL DATA:  Body aches since yesterday. EXAM: PORTABLE CHEST 1 VIEW COMPARISON:  None. FINDINGS: Lungs are hypoinflated without consolidation or effusion. Cardiomediastinal silhouette, bones and soft tissues are unremarkable. IMPRESSION: Hypoinflation without acute cardiopulmonary disease. Electronically Signed   By: Elberta Fortis M.D.   On: 12/01/2020 13:28    Procedures Procedures (including critical care time)  Medications Ordered in ED Medications  ketorolac (TORADOL) injection 60 mg (60 mg Intramuscular Given 12/01/20 1358)  gadobutrol (GADAVIST) 1 MMOL/ML injection 9 mL (9 mLs Intravenous Contrast Given 12/01/20 1546)    ED Course  I have reviewed the triage vital signs and the nursing notes.  Pertinent labs & imaging results that were available during my  care of the patient were reviewed by me and considered in my medical decision making (see chart for details).    MDM Rules/Calculators/A&P                         44 year old female with myalgias since Monday.  On presentation, she is alert, oriented, nontoxic-appearing, no acute distress, resting comfortably in the ER bed.  Physical exam with clear lung sounds, soft nontender abdomen.  She has associated C-spine and paraspinal muscle tenderness.  Normal range of motion of neck.  Vitals on arrival are overall reassuring, afebrile, not hypoxic tachycardic.  DDx includes infectious process including viral URI, COVID-19, sepsis, osteomyelitis/discitis of the neck, UTI.  Her lab work here is overall very reassuring.  Her CBC is without leukocytosis, CMP unremarkable.  Normal CK.  Negative Covid and flu.  Her chest x-ray is without evidence  of infection.  Given increasing neck pain and complaints of numbness, an MRI of the cervical spine with and without contrast was ordered.  This was overall negative for any acute findings, she has mild cervical spondylosis and mild spinal stenosis at 6-7.  No evidence of encroachment on the spinal canal.  Overall work-up reassuring.  Pt unable to provide a UA here but has no urinary symptoms so suspicion for UTI is low. Unclear source of the patient's myalgias. On reassessment pt reports mild improvement with Toradol.  However work-up here overall reassuring for any life-threatening causes of this. Pt requesting NSAID script, will send 800mg  of ibuprofen.  Encourage PCP follow-up with her neck does not improve.  She was understanding and is agreeable.  At this stage in the ED course, the patient is medically screened and stable for discharge. Final Clinical Impression(s) / ED Diagnoses Final diagnoses:  Myalgia  Neck pain    Rx / DC Orders ED Discharge Orders         Ordered    ibuprofen (ADVIL) 800 MG tablet  3 times daily        12/01/20 1630           12/03/20 12/01/20 1632    12/03/20, MD 12/02/20 [phone]

## 2020-12-01 NOTE — ED Triage Notes (Signed)
Pt arrives to ED with c/o of generalized body aches since yesterday. Pt states that her neck has been the worst. Pt denies fever, chills, SOB, N/V.

## 2021-02-14 ENCOUNTER — Encounter: Payer: Self-pay | Admitting: Family Medicine

## 2021-11-11 NOTE — Progress Notes (Signed)
Established Patient Office Visit  Subjective:  Patient ID: Sara Rich, female    DOB: 08-Jan-1976  Age: 45 y.o. MRN: [id number]  CC:  Chief Complaint  Patient presents with   Neck Pain    HPI Sara Rich presents for primary care visit to establish and neck pain.  The patient visit was assisted by [facility] interpreter audio interpreter Karna Dupes [phone] for [place].  This patient's been in [place] for 14 years from [place].  She speaks [place].  The patient does need colon cancer screening.  She has been complaining of increased memory loss and increased neck and knee pain.  The patient does have insurance she works at [employer] in [place].  She states she is forgetful at times and the memory loss appears to be worsening.  She denies alcohol use.  Patient does not smoke.  Patient does not have hypertension and on arrival blood pressure is good at 102/66  Patient has not been able to achieve citizenship and she would like to have an excuse to avoid taking citizenship exam because of memory.  She has not had any formal testing with her memory.  The patient declined on arrival flu vaccine and she is never had COVID vaccination  This is a former primary care patient of Dr. Chapman Fitch.  She does need repeat health screening labs.  She does need a Pap smear as well.  The patient previously had problems with neck pain and had a cervical MRI in 2019 which showed mild disease and mild spinal narrowing.  She complains of knee pain as well that is progressive.    Past Medical History:  Diagnosis Date   Known health problems: none     Past Surgical History:  Procedure Laterality Date   surgery on left index finger      Family History  Problem Relation Age of Onset   Diabetes Neg Hx    Hypertension Neg Hx    Cancer Neg Hx     Social History   Socioeconomic History   Marital status: Married    Spouse name: Not on file   Number of  children: 4   Years of education: [employer] school   Highest education level: Not on file  Occupational History   Occupation: Works at a IT consultant  Tobacco Use   Smoking status: Never   Smokeless tobacco: Never  Vaping Use   Vaping Use: Never used  Substance and Sexual Activity   Alcohol use: Never   Drug use: Never   Sexual activity: Not on file  Other Topics Concern   Not on file  Social History Narrative   ** Merged History Encounter **       Social Determinants of Health   Financial Resource Strain: Not on file  Food Insecurity: Not on file  Transportation Needs: Not on file  Physical Activity: Not on file  Stress: Not on file  Social Connections: Not on file  Intimate Partner Violence: Not on file    Outpatient Medications Prior to Visit  Medication Sig Dispense Refill   ibuprofen (ADVIL) 800 MG tablet Take 1 tablet (800 mg total) by mouth 3 (three) times daily. (Patient not taking: Reported on 11/12/2021) 21 tablet 0   naproxen (NAPROSYN) 500 MG tablet One tablet twice per day after a meal as needed for pain (Patient not taking: Reported on 11/12/2021) 60 tablet 3   No facility-administered medications prior to visit.    No  Known Allergies  ROS Review of Systems  Constitutional:  Negative for chills, diaphoresis and fever.  HENT:  Negative for congestion, hearing loss, nosebleeds, sore throat and tinnitus.   Eyes:  Negative for photophobia and redness.  Respiratory:  Negative for cough, shortness of breath, wheezing and stridor.   Cardiovascular:  Negative for chest pain, palpitations and leg swelling.  Gastrointestinal:  Negative for abdominal pain, blood in stool, constipation, diarrhea, nausea and vomiting.  Endocrine: Negative for polydipsia.  Genitourinary:  Negative for dysuria, flank pain, frequency, hematuria and urgency.  Musculoskeletal:  Positive for neck pain and neck stiffness. Negative for back pain and myalgias.       Bilateral knee  pain  Skin:  Negative for rash.  Allergic/Immunologic: Negative for environmental allergies.  Neurological:  Negative for dizziness, tremors, seizures, weakness and headaches.       Memory loss  Hematological:  Does not bruise/bleed easily.  Psychiatric/Behavioral: Negative.  Negative for suicidal ideas. The patient is not nervous/anxious.      Objective:    Physical Exam Vitals reviewed.  Constitutional:      Appearance: Normal appearance. She is well-developed. She is obese. She is not diaphoretic.  HENT:     Head: Normocephalic and atraumatic.     Nose: No nasal deformity, septal deviation, mucosal edema or rhinorrhea.     Right Sinus: No maxillary sinus tenderness or frontal sinus tenderness.     Left Sinus: No maxillary sinus tenderness or frontal sinus tenderness.     Mouth/Throat:     Pharynx: No oropharyngeal exudate.  Eyes:     General: No scleral icterus.    Conjunctiva/sclera: Conjunctivae normal.     Pupils: Pupils are equal, round, and reactive to light.  Neck:     Thyroid: No thyromegaly.     Vascular: No carotid bruit or JVD.     Trachea: Trachea normal. No tracheal tenderness or tracheal deviation.  Cardiovascular:     Rate and Rhythm: Normal rate and regular rhythm.     Chest Wall: PMI is not displaced.     Pulses: Normal pulses. No decreased pulses.     Heart sounds: Normal heart sounds, S1 normal and S2 normal. Heart sounds not distant. No murmur heard. No systolic murmur is present.  No diastolic murmur is present.    No friction rub. No gallop. No S3 or S4 sounds.  Pulmonary:     Effort: No tachypnea, accessory muscle usage or respiratory distress.     Breath sounds: No stridor. No decreased breath sounds, wheezing, rhonchi or rales.  Chest:     Chest wall: No tenderness.  Abdominal:     General: Bowel sounds are normal. There is no distension.     Palpations: Abdomen is soft. Abdomen is not rigid.     Tenderness: There is no abdominal tenderness.  There is no guarding or rebound.  Musculoskeletal:        General: Normal range of motion.     Cervical back: Normal range of motion and neck supple. No edema, erythema or rigidity. No muscular tenderness. Normal range of motion.  Lymphadenopathy:     Head:     Right side of head: No submental or submandibular adenopathy.     Left side of head: No submental or submandibular adenopathy.     Cervical: No cervical adenopathy.  Skin:    General: Skin is warm and dry.     Coloration: Skin is not pale.     Findings: No  rash.     Nails: There is no clubbing.  Neurological:     General: No focal deficit present.     Mental Status: She is alert and oriented to person, place, and time. Mental status is at baseline.     Cranial Nerves: No cranial nerve deficit.     Sensory: No sensory deficit.  Psychiatric:        Mood and Affect: Mood normal.        Speech: Speech normal.        Behavior: Behavior normal.        Thought Content: Thought content normal.    BP 102/66   Pulse 73   Resp 16   Wt 190 lb 12.8 oz (86.5 kg)   SpO2 97%   BMI 30.80 kg/m  Wt Readings from Last 3 Encounters:  11/12/21 190 lb 12.8 oz (86.5 kg)  08/16/20 199 lb (90.3 kg)  12/13/19 202 lb 9.6 oz (91.9 kg)     Health Maintenance Due  Topic Date Due   COVID-19 Vaccine (1) Never done   Hepatitis C Screening  Never done   PAP SMEAR-Modifier  Never done   INFLUENZA VACCINE  Never done    There are no preventive care reminders to display for this patient.  No results found for: TSH Lab Results  Component Value Date   WBC 6.2 12/01/2020   HGB 12.1 12/01/2020   HCT 38.7 12/01/2020   MCV 88.2 12/01/2020   PLT 268 12/01/2020   Lab Results  Component Value Date   NA 139 12/01/2020   K 3.6 12/01/2020   CO2 23 12/01/2020   GLUCOSE 89 12/01/2020   BUN 7 12/01/2020   CREATININE 0.73 12/01/2020   BILITOT 0.6 12/01/2020   ALKPHOS 62 12/01/2020   AST 21 12/01/2020   ALT 15 12/01/2020   PROT 6.6 12/01/2020    ALBUMIN 3.6 12/01/2020   CALCIUM 9.2 12/01/2020   ANIONGAP 10 12/01/2020   No results found for: CHOL No results found for: HDL No results found for: LDLCALC No results found for: TRIG No results found for: CHOLHDL No results found for: HGBA1C    Assessment & Plan:   Problem List Items Addressed This Visit       Nervous and Auditory   Cervical radiculopathy    Symptoms and exam consistent with cervical radiculopathy plan referral to orthopedics      Relevant Orders   MR Cervical Spine Wo Contrast   Ambulatory referral to Orthopedic Surgery     Other   Neck pain, musculoskeletal    Persistent neck pain and associated paresthesias in the hands  Prior abnormal MRI of the cervical spine  Plan referral to orthopedic surgery and repeat MRI of the spine to see if there is been progression in disease      Paresthesia of right upper extremity    As per neck pain assessment      Relevant Orders   MR Cervical Spine Wo Contrast   Memory loss   Relevant Orders   Ambulatory referral to Neurology   Chronic pain of both knees    Plan imaging of both knees and referral to orthopedics      Relevant Orders   DG Knee Complete 4 Views Right   DG Knee Complete 4 Views Left   Ambulatory referral to Orthopedic Surgery   Other Visit Diagnoses     Encounter for screening for HIV    -  Primary   Relevant Orders  HIV Antibody (routine testing w rflx)   Need for hepatitis C screening test       Relevant Orders   HCV Ab w Reflex to Quant PCR   Colon cancer screening       Relevant Orders   Fecal occult blood, imunochemical   Encounter for health-related screening       Relevant Orders   Comprehensive metabolic panel   CBC with Differential/Platelet   Hemoglobin A1c      Will give fecal occult kit for colon cancer screening Patient declined flu vaccine No orders of the defined types were placed in this encounter.   Follow-up: No follow-ups on file.    Asencion Noble, MD

## 2021-11-12 ENCOUNTER — Encounter: Payer: Self-pay | Admitting: Critical Care Medicine

## 2021-11-12 ENCOUNTER — Ambulatory Visit
Admission: RE | Admit: 2021-11-12 | Discharge: 2021-11-12 | Disposition: A | Discharge status: Home / Self Care | Payer: BC Managed Care – PPO | Source: Ambulatory Visit | Attending: Critical Care Medicine | Admitting: Critical Care Medicine

## 2021-11-12 ENCOUNTER — Ambulatory Visit: Payer: BC Managed Care – PPO | Attending: Critical Care Medicine | Admitting: Critical Care Medicine

## 2021-11-12 ENCOUNTER — Other Ambulatory Visit: Payer: Self-pay

## 2021-11-12 VITALS — BP 102/66 | HR 73 | Resp 16 | Wt 190.8 lb

## 2021-11-12 DIAGNOSIS — G8929 Other chronic pain: Secondary | ICD-10-CM

## 2021-11-12 DIAGNOSIS — Z114 Encounter for screening for human immunodeficiency virus [HIV]: Secondary | ICD-10-CM | POA: Diagnosis not present

## 2021-11-12 DIAGNOSIS — Z1211 Encounter for screening for malignant neoplasm of colon: Secondary | ICD-10-CM

## 2021-11-12 DIAGNOSIS — M5412 Radiculopathy, cervical region: Secondary | ICD-10-CM | POA: Diagnosis not present

## 2021-11-12 DIAGNOSIS — M25561 Pain in right knee: Secondary | ICD-10-CM

## 2021-11-12 DIAGNOSIS — Z1159 Encounter for screening for other viral diseases: Secondary | ICD-10-CM

## 2021-11-12 DIAGNOSIS — R202 Paresthesia of skin: Secondary | ICD-10-CM

## 2021-11-12 DIAGNOSIS — M542 Cervicalgia: Secondary | ICD-10-CM

## 2021-11-12 DIAGNOSIS — M25562 Pain in left knee: Secondary | ICD-10-CM

## 2021-11-12 DIAGNOSIS — Z139 Encounter for screening, unspecified: Secondary | ICD-10-CM

## 2021-11-12 DIAGNOSIS — R413 Other amnesia: Secondary | ICD-10-CM

## 2021-11-12 IMAGING — CR DG KNEE COMPLETE 4+V*L*
4 series · 4 of 4 positions shown · non-contrast
Comparison: None.

CLINICAL DATA: Chronic bilateral knee pain for 5 months. Swelling
and lateral tenderness

EXAM:
LEFT KNEE - COMPLETE 4+ VIEW

[w knee ap left (1 of 2)]
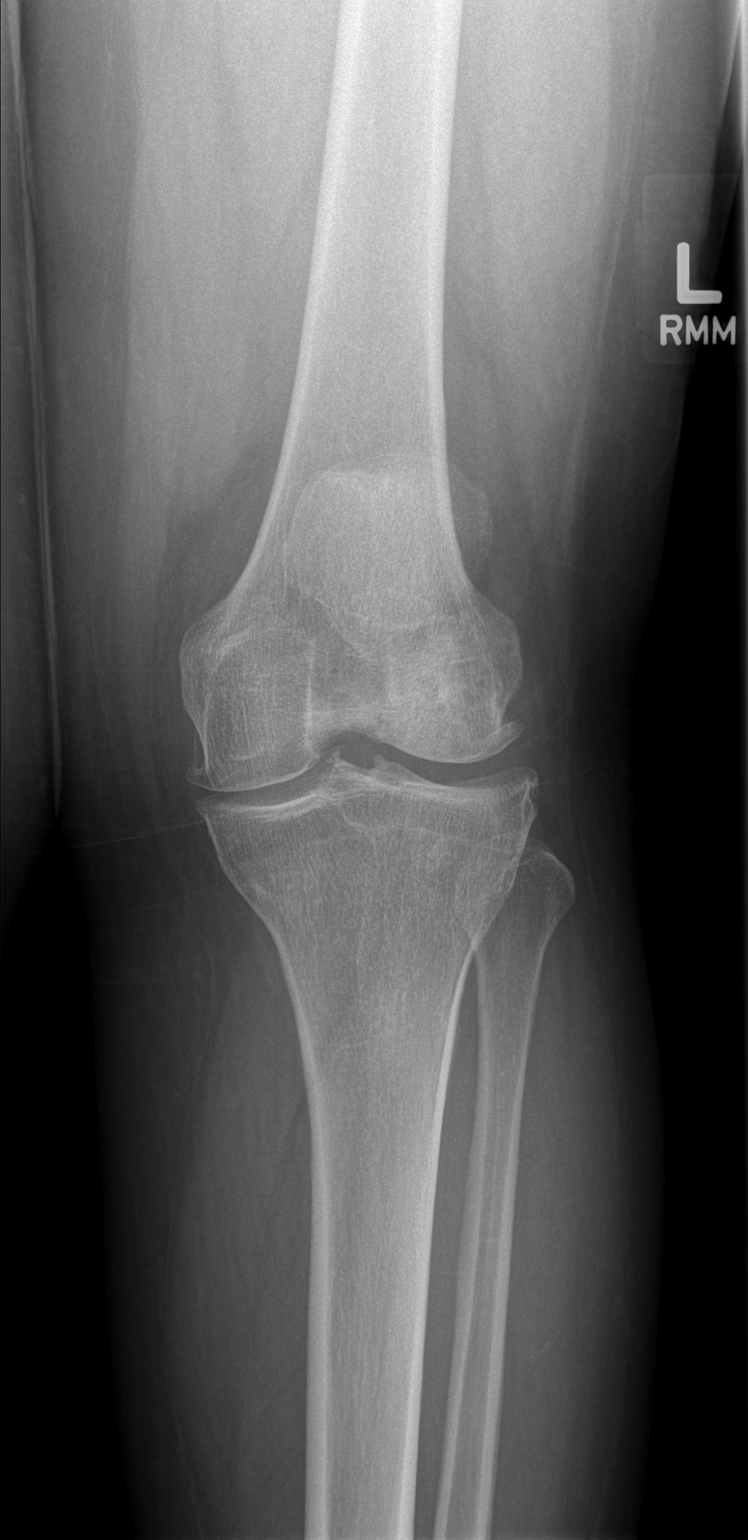

[w knee lat. left]
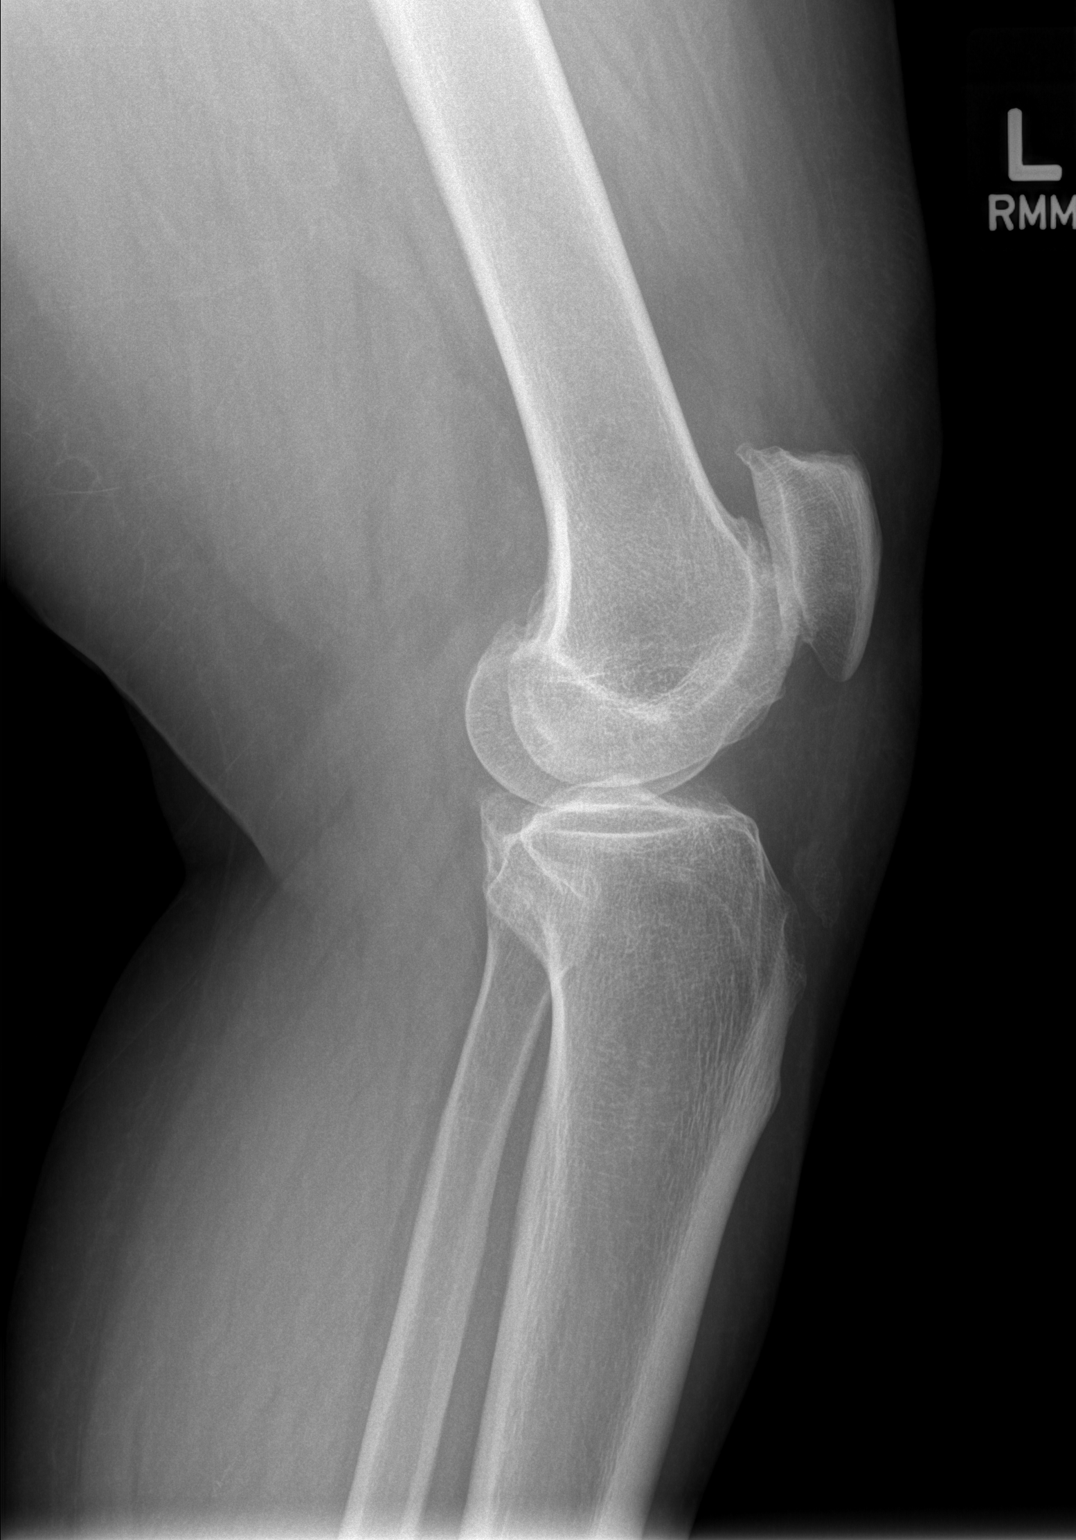

[w knee ap left (2 of 2)]
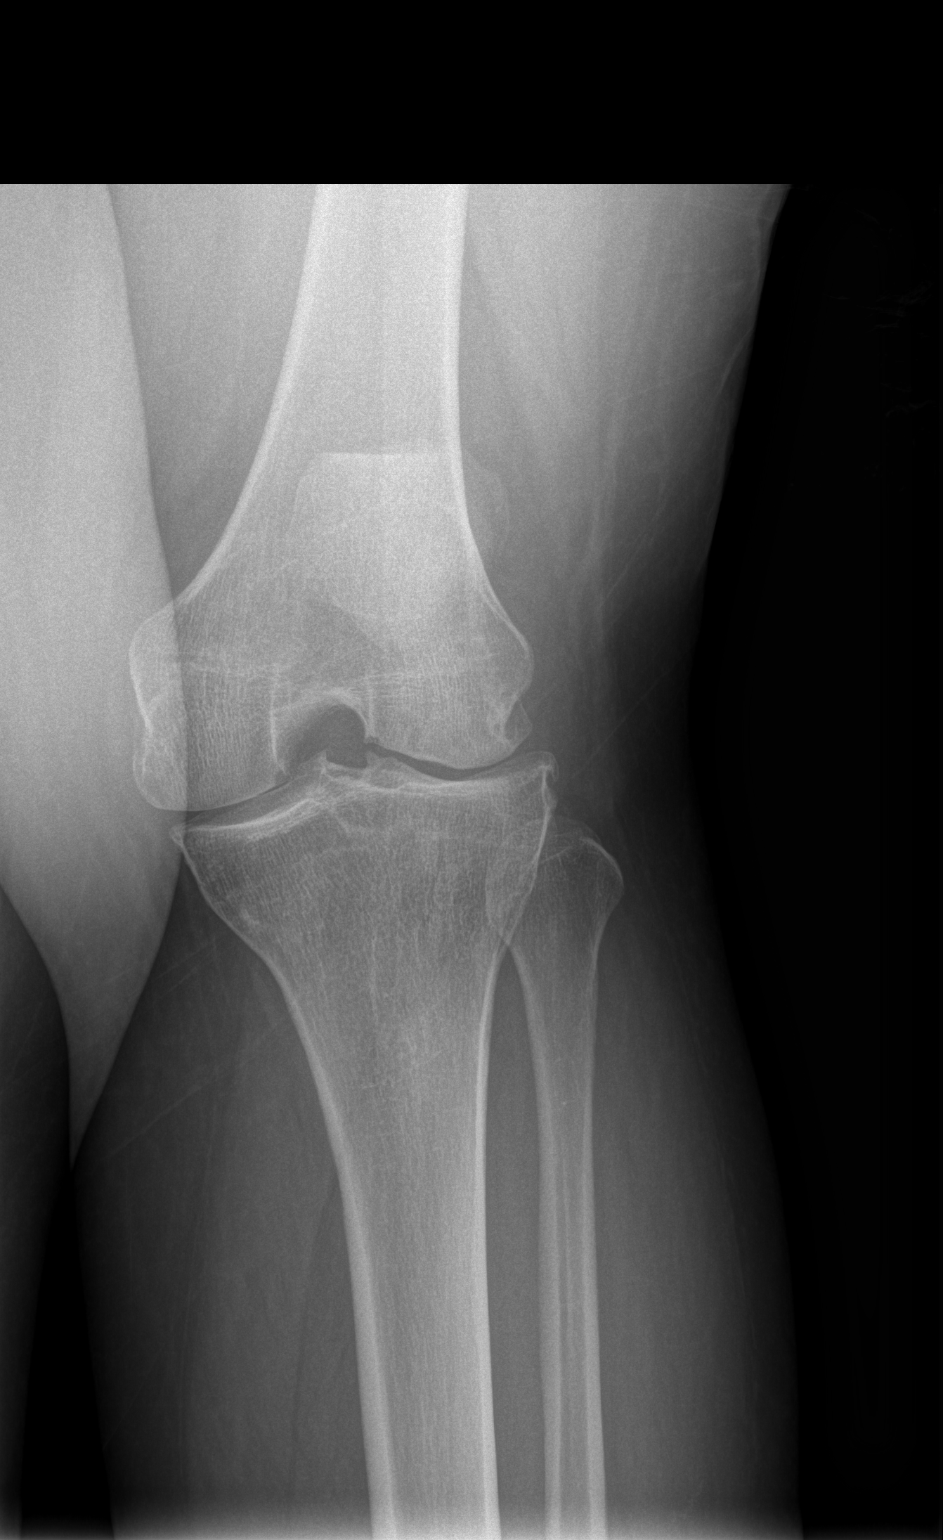

[w knee ap left *]
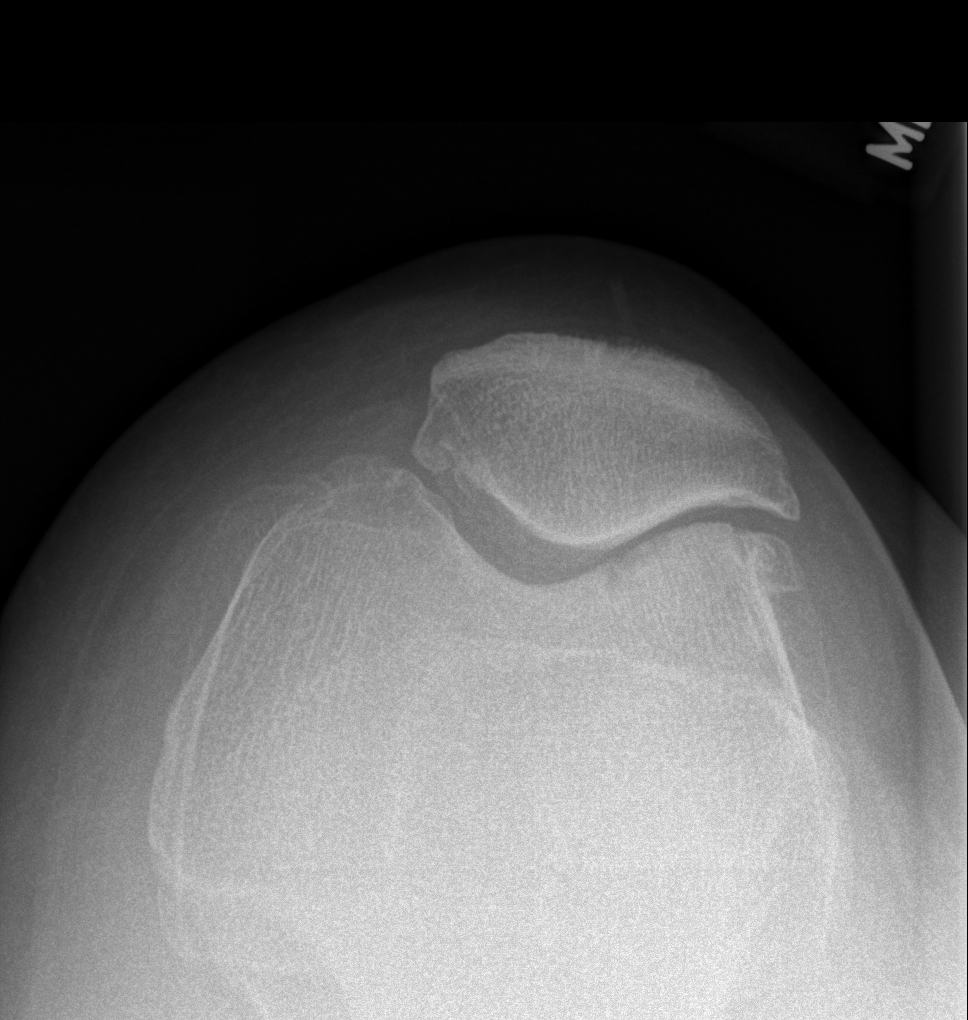

[4 of 4 positions shown; findings below may reference images not displayed]

FINDINGS: Slight lateral patellar subluxation, otherwise normal alignment.
Lateral patellofemoral joint space narrowing. Minimal medial
tibiofemoral joint space narrowing. Moderate tricompartmental
peripheral spurring as well as spurring of the tibial spines. No
fracture or erosion. No focal bone abnormality. Minimal knee joint
effusion. Fine osseous density adjacent to the anterior tibial spine
may represent sequela of remote injury or an unfused apophysis.
IMPRESSION: 1. Moderate tricompartmental osteoarthritis with minimal joint
effusion.
2. Slight lateral patellar subluxation.

## 2021-11-12 IMAGING — CR DG KNEE COMPLETE 4+V*R*
4 series · 4 of 4 positions shown · non-contrast
Comparison: None.

CLINICAL DATA: Chronic bilateral knee pain for 5 months. Swelling
and lateral tenderness.

EXAM:
RIGHT KNEE - COMPLETE 4+ VIEW

[w knee ap right (1 of 2)]
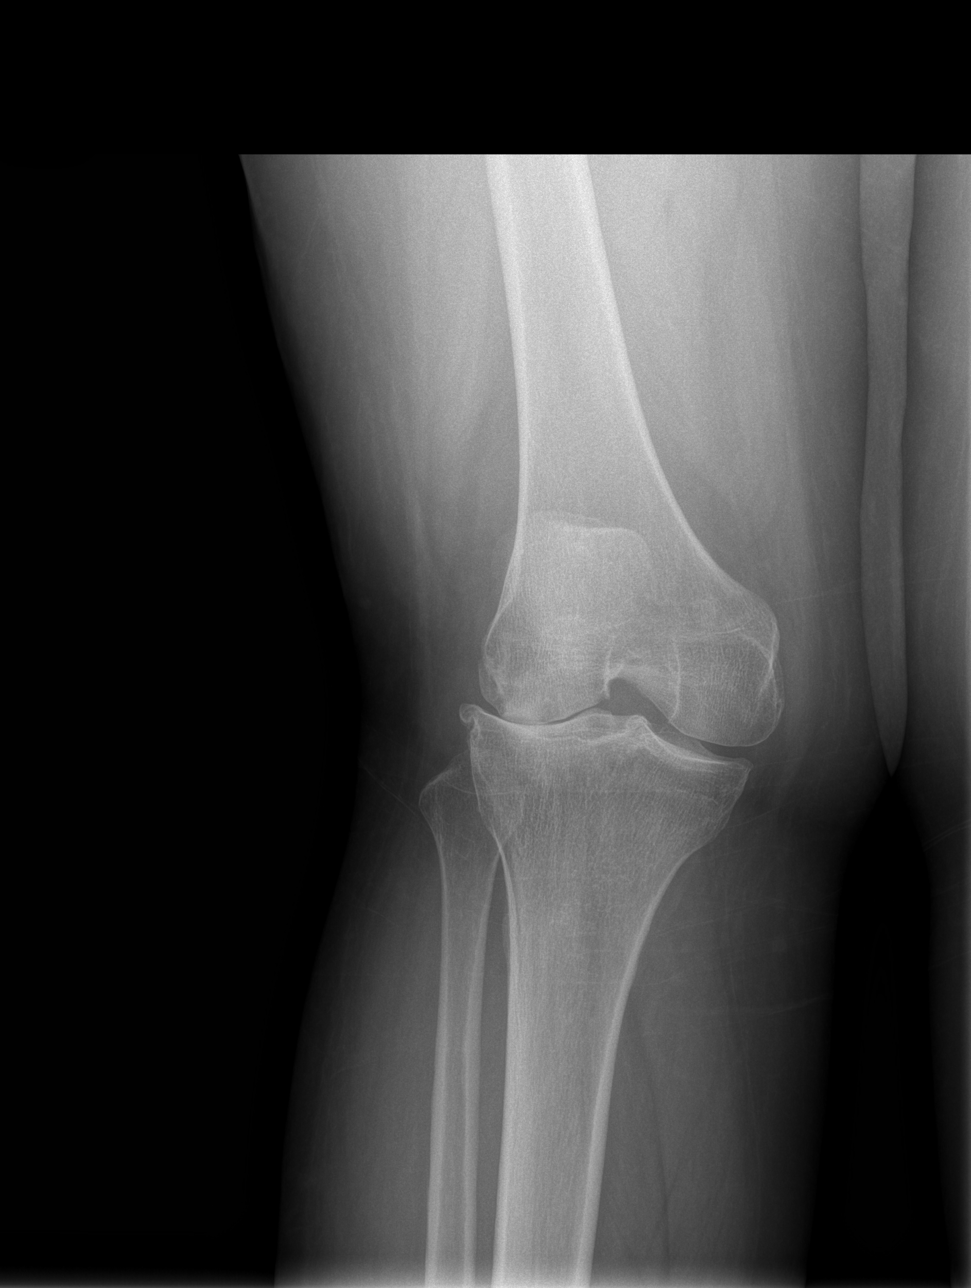

[w knee ap right (2 of 2)]
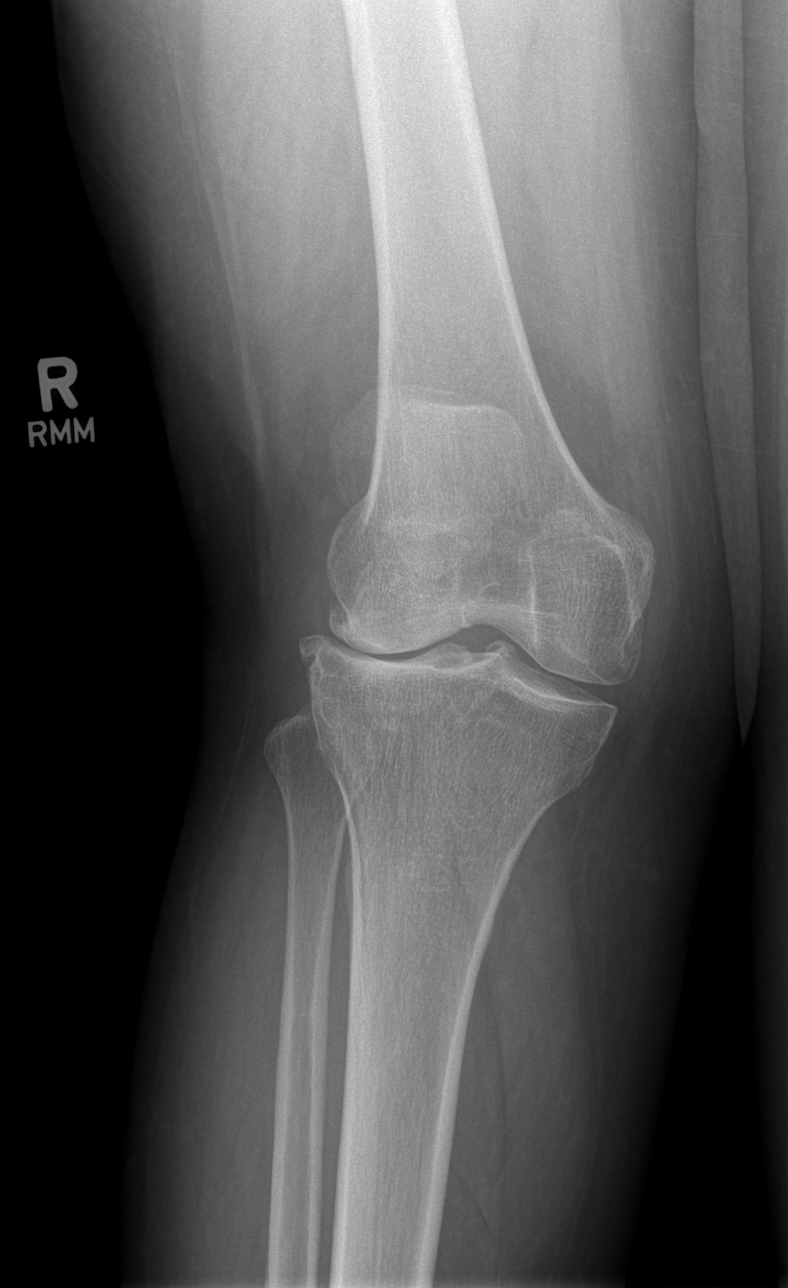

[w knee lat. right]
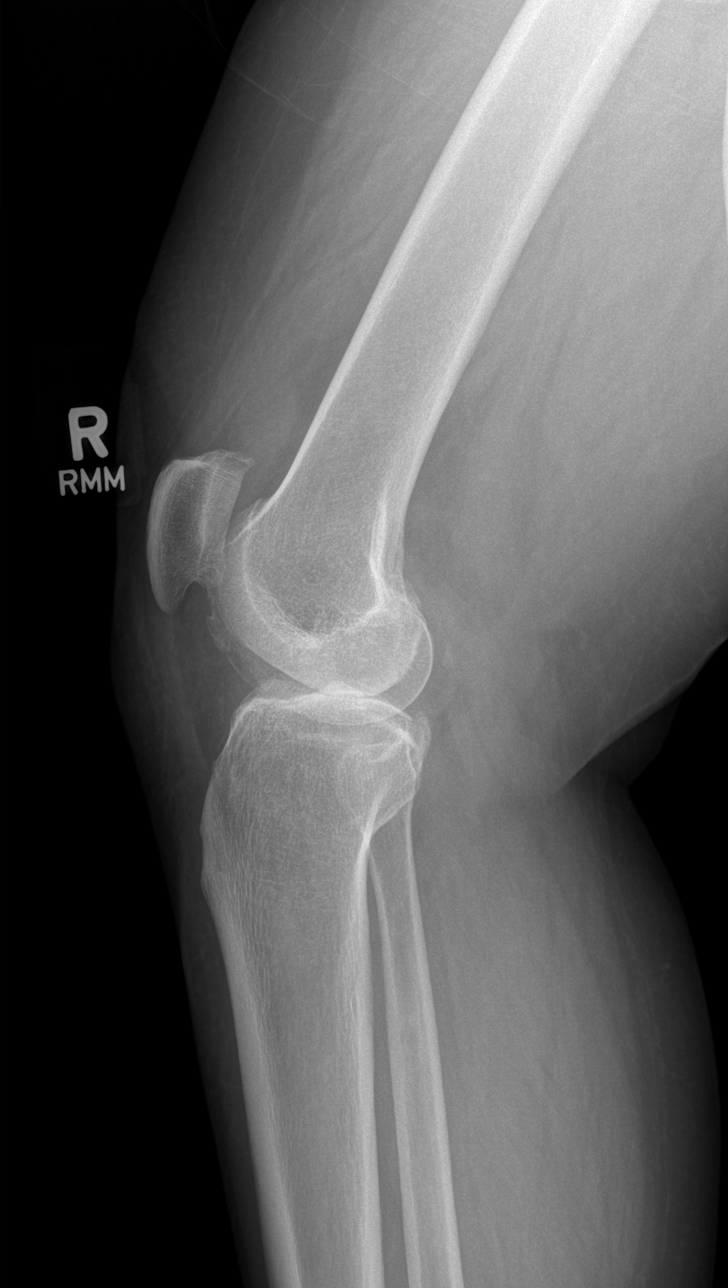

[w knee ap right *]
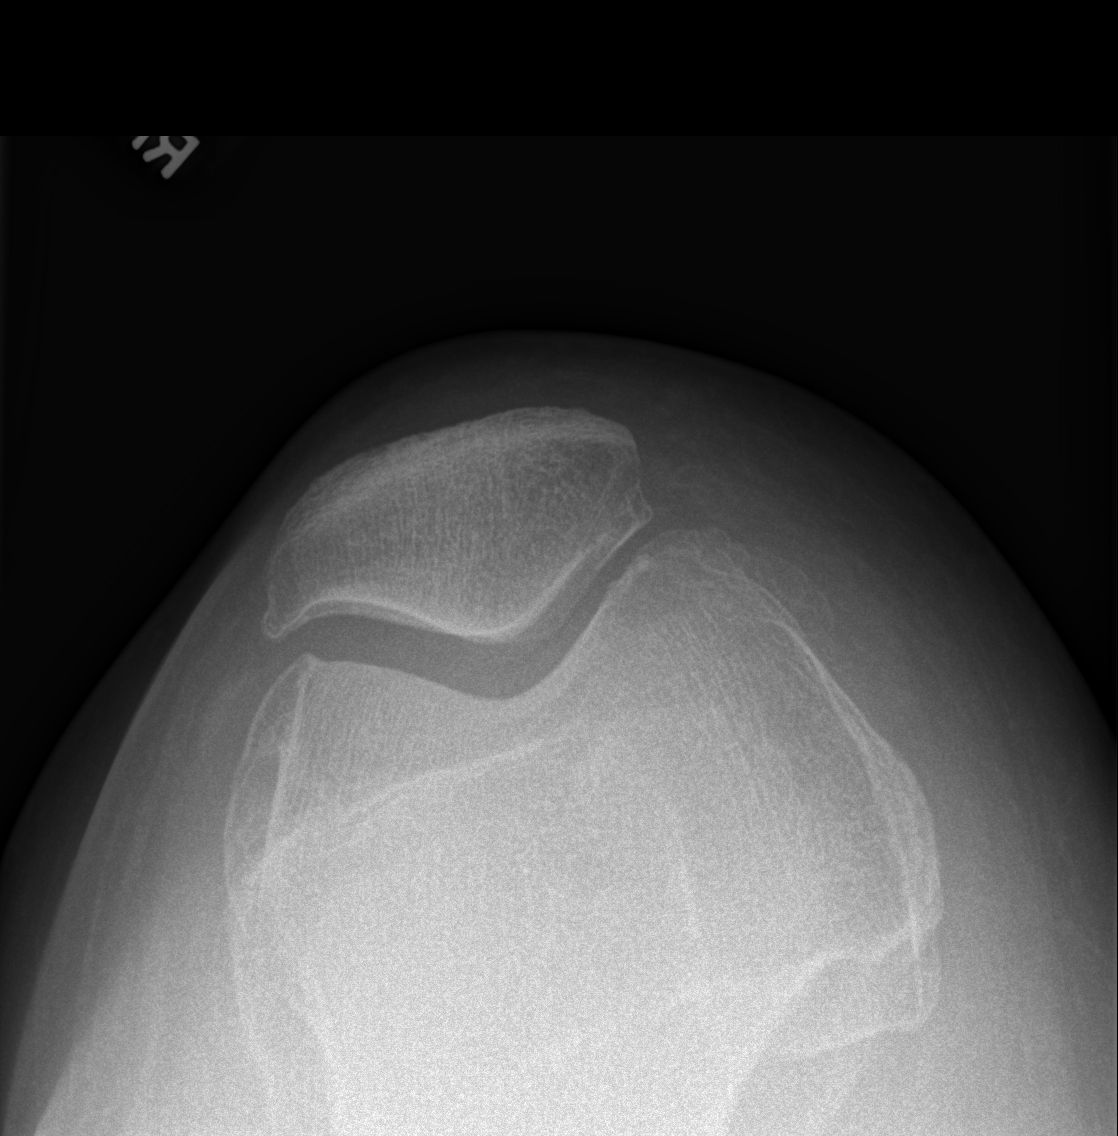

[4 of 4 positions shown; findings below may reference images not displayed]

FINDINGS: Valgus angulation. Lateral tibiofemoral joint space narrowing with
moderate peripheral osteophytes and subchondral cystic change. There
is mild patellofemoral degenerative change with osteophytes, as well
as the central femoral osteophyte. Small knee joint effusion. No
fracture, erosion, or focal bone abnormality.
IMPRESSION: 1. Valgus angulation.
2. Moderate osteoarthritis, most prominently affecting lateral
tibiofemoral compartment. Small knee joint effusion.

## 2021-11-12 NOTE — Patient Instructions (Addendum)
Complete screening labs will be done today also colon cancer screening kit will be issued please process and bring back to the lab  Referral to orthopedic surgery be made  Referral to neurology will be made  MRI of the neck will be obtained  X-rays of both knees will be obtained go [facility] next-door for those x-rays you do not need an appointment  Return to see Dr. 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 Veuillez la traiter et la rapporter au [place].  L'orientation vers la chirurgie orthopdique doit tre faite  L'orientation vers la neurologie sera faite  Une IRM du cou sera obtenue  Des radiographies des deux genoux seront obtenues, Statistician au centre [employer] [facility] [employer]  ct pour les radiographies pour lesquelles vous n'avez pas besoin de Ecologist Dr Joya Gaskins 4 mois

## 2021-11-12 NOTE — Assessment & Plan Note (Signed)
Symptoms and exam consistent with cervical radiculopathy plan referral to orthopedics

## 2021-11-12 NOTE — Assessment & Plan Note (Signed)
As per neck pain assessment 

## 2021-11-12 NOTE — Assessment & Plan Note (Signed)
Plan imaging of both knees and referral to orthopedics

## 2021-11-12 NOTE — Assessment & Plan Note (Signed)
Persistent neck pain and associated paresthesias in the hands  Prior abnormal MRI of the cervical spine  Plan referral to orthopedic surgery and repeat MRI of the spine to see if there is been progression in disease

## 2021-11-14 ENCOUNTER — Encounter: Payer: Self-pay | Admitting: Physician Assistant

## 2021-11-14 ENCOUNTER — Telehealth: Payer: Self-pay

## 2021-11-14 LAB — HCV RT-PCR, QUANT (NON-GRAPH): Hepatitis C Quantitation: NOT DETECTED IU/mL

## 2021-11-14 LAB — COMPREHENSIVE METABOLIC PANEL
ALT: 14 IU/L (ref 0–32)
AST: 19 IU/L (ref 0–40)
Albumin/Globulin Ratio: 1.5 (ref 1.2–2.2)
Albumin: 4.1 g/dL (ref 3.8–4.8)
Alkaline Phosphatase: 96 IU/L (ref 44–121)
BUN/Creatinine Ratio: 9 (ref 9–23)
BUN: 7 mg/dL (ref 6–24)
Bilirubin Total: <0.2 mg/dL (ref 0.0–1.2)
CO2: 21 mmol/L (ref 20–29)
Calcium: 10.3 mg/dL — ABNORMAL HIGH (ref 8.7–10.2)
Chloride: 108 mmol/L — ABNORMAL HIGH (ref 96–106)
Creatinine, Ser: 0.74 mg/dL (ref 0.57–1.00)
Globulin, Total: 2.7 g/dL (ref 1.5–4.5)
Glucose: 92 mg/dL (ref 70–99)
Potassium: 4.3 mmol/L (ref 3.5–5.2)
Sodium: 143 mmol/L (ref 134–144)
Total Protein: 6.8 g/dL (ref 6.0–8.5)
eGFR: 101 mL/min/{1.73_m2} (ref >59)

## 2021-11-14 LAB — CBC WITH DIFFERENTIAL/PLATELET
Basophils Absolute: 0.1 10*3/uL (ref 0.0–0.2)
Basos: 1 %
EOS (ABSOLUTE): 0.2 10*3/uL (ref 0.0–0.4)
Eos: 4 %
Hematocrit: 37 % (ref 34.0–46.6)
Hemoglobin: 12.2 g/dL (ref 11.1–15.9)
Immature Grans (Abs): 0 10*3/uL (ref 0.0–0.1)
Immature Granulocytes: 1 %
Lymphocytes Absolute: 2.8 10*3/uL (ref 0.7–3.1)
Lymphs: 56 %
MCH: 26.7 pg (ref 26.6–33.0)
MCHC: 33 g/dL (ref 31.5–35.7)
MCV: 81 fL (ref 79–97)
Monocytes Absolute: 0.4 10*3/uL (ref 0.1–0.9)
Monocytes: 8 %
Neutrophils Absolute: 1.5 10*3/uL (ref 1.4–7.0)
Neutrophils: 30 %
Platelets: 416 10*3/uL (ref 150–450)
RBC: 4.57 x10E6/uL (ref 3.77–5.28)
RDW: 13 % (ref 11.7–15.4)
WBC: 4.9 10*3/uL (ref 3.4–10.8)

## 2021-11-14 LAB — HEMOGLOBIN A1C
Est. average glucose Bld gHb Est-mCnc: 108 mg/dL
Hgb A1c MFr Bld: 5.4 % (ref 4.8–5.6)

## 2021-11-14 LAB — HIV ANTIBODY (ROUTINE TESTING W REFLEX): HIV Screen 4th Generation wRfx: NONREACTIVE

## 2021-11-14 LAB — HCV AB W REFLEX TO QUANT PCR: HCV Ab: 6.5 s/co ratio — ABNORMAL HIGH (ref 0.0–0.9)

## 2021-11-14 NOTE — Telephone Encounter (Signed)
Pt was called and vm was left. Information was sent to nurse pool   Interpreter:Mefambi Number: [phone]

## 2021-11-14 NOTE — Telephone Encounter (Signed)
-----   Message from [name], MD sent at 11/14/2021  1:45 PM EST ----- Let pt know hiv hep c neg,  liver kidney normal blood count normal.  No diabetes.

## 2021-11-19 ENCOUNTER — Ambulatory Visit: Payer: BC Managed Care – PPO | Admitting: Physician Assistant

## 2021-11-19 NOTE — Progress Notes (Incomplete)
Assessment/Plan:   Sara Rich is a very pleasant 45 y.o. year old RH [place] speaker  female from  [place], with a history of  hypertension seen today for evaluation of memory loss. MoCA today     Recommendations:   Memory Loss   MRI brain with/without contrast to assess for underlying structural abnormality and assess vascular load  Neurocognitive testing to further evaluate cognitive concerns and determine underlying cause of memory changes, including potential contribution from sleep, anxiety, or depression  Check B12, TSH Discussed safety both in and out of the home.  Discussed the importance of regular daily schedule with inclusion of crossword puzzles to maintain brain function.  Continue to monitor mood with PCP.  Stay active at least 30 minutes at least 3 times a week.  Naps should be scheduled and should be no longer than 60 minutes and should not occur after 2 PM.  Mediterranean diet is recommended  Folllow up once results above are available   Subjective:    The patient is seen in neurologic consultation at the request of [name], MD for the evaluation of memory.  The patient is accompanied by  who supplements the history. This is a 45 y.o. year old RH  female who has had memory issues for about     Memory Repeats same stories and asks same questions Does not know what came to the room for Leaving objects  Drive Lives with Mood Depression Irritability CW puzzles Word Finding Board Games Painting Coloring Sleeps Vivid Dreams Sleepwalking Hallucinations Paranoia Hygiene concerns Bathing Dressing Medications pillbox Finances  Appetite  trouble swallowing.  Cooks.  stove on or the faucet on. Ambulates   Falls Head injuries.  She also used to carry baskets of items on her head and walk for several miles regularly when she lived in [place] country.   Chronic neck pain with paresthesia with last cervical MRI in 2019 showing mild disease and  mild spinal narrowing, followed by Ortho Denies headaches, double vision, dizziness, focal numbness or tingling, unilateral weakness or tremors or anosmia. No history of seizures. Denies urine incontinence, retention, constipation or diarrhea.  Denies OSA, ETOH or Tobacco. 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   Patient has not been able to achieve citizenship and she would like to have an excuse to avoid taking citizenship exam because of memory.  She has not had any formal testing with her memory.  CBC normal Ca 10.3  HepC neg , neg HIV  MRI C spine 12/24/21No evidence of infection or other acute osseous abnormality in the cervical spine. 2. Mild cervical spondylosis with mild spinal stenosis at C6-7.  No Known Allergies  No current outpatient medications   VITALS:  There were no vitals filed for this visit. Depression screen [facility] 2/9 08/16/2020 09/03/2018  Decreased Interest 1 1  Down, Depressed, Hopeless 3 0  PHQ - 2 Score 4 1  Altered sleeping 1 -  Tired, decreased energy 1 -  Change in appetite 3 -  Feeling bad or failure about yourself  0 -  Trouble concentrating 0 -  Moving slowly or fidgety/restless 0 -  Suicidal thoughts 0 -  PHQ-9 Score 9 -    PHYSICAL EXAM   HEENT:  Normocephalic, atraumatic. The mucous membranes are moist. The superficial temporal arteries are without ropiness or tenderness. Cardiovascular: Regular rate and rhythm. Lungs: Clear to auscultation bilaterally. Neck: There are no carotid bruits noted bilaterally.  NEUROLOGICAL: No flowsheet  data found. No flowsheet data found.  No flowsheet data found.   Orientation:  Alert and oriented to person, place and time. No aphasia or dysarthria. Fund of knowledge is appropriate. Recent memory impaired and remote memory intact.  Attention and concentration are normal.  Able to name objects and repeat phrases. Delayed recall  /5 Cranial nerves: There is good facial symmetry.  Extraocular muscles are intact and visual fields are full to confrontational testing. Speech is fluent and clear. Soft palate rises symmetrically and there is no tongue deviation. Hearing is intact to conversational tone. Tone: Tone is good throughout. Sensation: Sensation is intact to light touch and pinprick throughout. Vibration is intact at the bilateral big toe.There is no extinction with double simultaneous stimulation. There is no sensory dermatomal level identified. Coordination: The patient has no difficulty with RAM's or FNF bilaterally. Normal finger to nose  Motor: Strength is 5/5 in the bilateral upper and lower extremities. There is no pronator drift. There are no fasciculations noted. DTR's: Deep tendon reflexes are 2/4 at the bilateral biceps, triceps, brachioradialis, patella and achilles.  Plantar responses are downgoing bilaterally. Gait and Station: The patient is able to ambulate without difficulty.The patient is able to heel toe walk without any difficulty.The patient is able to ambulate in a tandem fashion. The patient is able to stand in the Romberg position.     Thank you for allowing [place] the opportunity to participate in the care of this nice patient. Please do not hesitate to contact us for any questions or concerns.   Total time spent on today's visit was 60 minutes, including both face-to-face time and nonface-to-face time.  Time included that spent on review of records (prior notes available to me/labs/imaging if pertinent), discussing treatment and goals, answering patient's questions and coordinating care.  Cc:  [name], MD  Marlowe Kays 11/19/2021 6:57 AM

## 2021-11-19 NOTE — Patient Instructions (Incomplete)
Assessment/Plan:   Sara Rich is a very pleasant 45 y.o. year old RH female with risk factors including  age, hypertension, hyperlipidemia, anxiety, depression and  seen today for evaluation of memory loss. MoCA today     Recommendations:   Memory Loss   MRI brain with/without contrast to assess for underlying structural abnormality and assess vascular load  Neurocognitive testing to further evaluate cognitive concerns and determine underlying cause of memory changes, including potential contribution from sleep, anxiety, or depression  Check B12, TSH Discussed safety both in and out of the home.  Discussed the importance of regular daily schedule with inclusion of crossword puzzles to maintain brain function.  Continue to monitor mood with PCP.  Stay active at least 30 minutes at least 3 times a week.  Naps should be scheduled and should be no longer than 60 minutes and should not occur after 2 PM.  Mediterranean diet is recommended  Folllow up once results above are available   Subjective:    The patient is seen in neurologic consultation at the request of [name], MD for the evaluation of memory.  The patient is accompanied by  who supplements the history. This is a 45 y.o. year old RH  female who has had memory issues for about     Memory Repeats same stories and asks same questions Does not know what came to the room for Leaving objects  Drive Lives with Mood Depression Irritability CW puzzles Word Finding Board Games Painting Coloring Sleeps Vivid Dreams Sleepwalking Hallucinations Paranoia Hygiene concerns Bathing Dressing Medications pillbox Finances  Appetite  trouble swallowing.  Cooks.  stove on or the faucet on. Ambulates   Falls Head injuries    Denies headaches, double vision, dizziness, focal numbness or tingling, unilateral weakness or tremors or anosmia. No history of seizures. Denies urine incontinence, retention,  constipation or diarrhea.  Denies OSA, ETOH or Tobacco. Family History     No Known Allergies  @MEDSCONDENSED @   VITALS:  There were no vitals filed for this visit. Depression screen [facility] 2/9 08/16/2020 09/03/2018  Decreased Interest 1 1  Down, Depressed, Hopeless 3 0  PHQ - 2 Score 4 1  Altered sleeping 1 -  Tired, decreased energy 1 -  Change in appetite 3 -  Feeling bad or failure about yourself  0 -  Trouble concentrating 0 -  Moving slowly or fidgety/restless 0 -  Suicidal thoughts 0 -  PHQ-9 Score 9 -    PHYSICAL EXAM   HEENT:  Normocephalic, atraumatic. The mucous membranes are moist. The superficial temporal arteries are without ropiness or tenderness. Cardiovascular: Regular rate and rhythm. Lungs: Clear to auscultation bilaterally. Neck: There are no carotid bruits noted bilaterally.  NEUROLOGICAL: No flowsheet data found. No flowsheet data found.  No flowsheet data found.   Orientation:  Alert and oriented to person, place and time. No aphasia or dysarthria. Fund of knowledge is appropriate. Recent memory impaired and remote memory intact.  Attention and concentration are normal.  Able to name objects and repeat phrases. Delayed recall  /5 Cranial nerves: There is good facial symmetry. Extraocular muscles are intact and visual fields are full to confrontational testing. Speech is fluent and clear. Soft palate rises symmetrically and there is no tongue deviation. Hearing is intact to conversational tone. Tone: Tone is good throughout. Sensation: Sensation is intact to light touch and pinprick throughout. Vibration is intact at the bilateral big toe.There is no extinction with double simultaneous stimulation.  There is no sensory dermatomal level identified. Coordination: The patient has no difficulty with RAM's or FNF bilaterally. Normal finger to nose  Motor: Strength is 5/5 in the bilateral upper and lower extremities. There is no pronator drift. There are no  fasciculations noted. DTR's: Deep tendon reflexes are 2/4 at the bilateral biceps, triceps, brachioradialis, patella and achilles.  Plantar responses are downgoing bilaterally. Gait and Station: The patient is able to ambulate without difficulty.The patient is able to heel toe walk without any difficulty.The patient is able to ambulate in a tandem fashion. The patient is able to stand in the Romberg position.     Thank you for allowing [place] the opportunity to participate in the care of this nice patient. Please do not hesitate to contact us for any questions or concerns.   Total time spent on today's visit was 60 minutes, including both face-to-face time and nonface-to-face time.  Time included that spent on review of records (prior notes available to me/labs/imaging if pertinent), discussing treatment and goals, answering patient's questions and coordinating care.  Cc:  [name], MD  Marlowe Kays 11/19/2021 6:58 AM

## 2021-11-21 ENCOUNTER — Encounter: Payer: Self-pay | Admitting: Physician Assistant

## 2021-11-21 ENCOUNTER — Other Ambulatory Visit: Payer: Self-pay

## 2021-11-21 ENCOUNTER — Other Ambulatory Visit (INDEPENDENT_AMBULATORY_CARE_PROVIDER_SITE_OTHER): Payer: BC Managed Care – PPO

## 2021-11-21 ENCOUNTER — Ambulatory Visit (INDEPENDENT_AMBULATORY_CARE_PROVIDER_SITE_OTHER): Payer: BC Managed Care – PPO | Admitting: Physician Assistant

## 2021-11-21 VITALS — BP 104/69 | HR 64 | Wt 187.8 lb

## 2021-11-21 DIAGNOSIS — R413 Other amnesia: Secondary | ICD-10-CM

## 2021-11-21 LAB — TSH: TSH: 0.87 u[IU]/mL (ref 0.35–5.50)

## 2021-11-21 LAB — VITAMIN B12: Vitamin B-12: 326 pg/mL (ref 211–911)

## 2021-11-21 NOTE — Patient Instructions (Addendum)
It was a pleasure to see you today at our office.   Recommendations:  MRI of the brain, the radiology office will call you to arrange you appointment Check labs today EEG  Follow up in 1 month   RECOMMENDATIONS FOR ALL PATIENTS WITH MEMORY PROBLEMS: 1. Continue to exercise (Recommend 30 minutes of walking everyday, or 3 hours every week) 2. Increase social interactions - continue going to [place] and enjoy social gatherings with friends and family 3. Eat healthy, avoid fried foods and eat more fruits and vegetables 4. Maintain adequate blood pressure, blood sugar, and blood cholesterol level. Reducing the risk of stroke and cardiovascular disease also helps promoting better memory. 5. Avoid stressful situations. Live a simple life and avoid aggravations. Organize your time and prepare for the next day in anticipation. 6. Sleep well, avoid any interruptions of sleep and avoid any distractions in the bedroom that may interfere with adequate sleep quality 7. Avoid sugar, avoid sweets as there is a strong link between excessive sugar intake, diabetes, and cognitive impairment We discussed the Mediterranean diet, which has been shown to help patients reduce the risk of progressive memory disorders and reduces cardiovascular risk. This includes eating fish, eat fruits and green leafy vegetables, nuts like almonds and hazelnuts, walnuts, and also use olive oil. Avoid fast foods and fried foods as much as possible. Avoid sweets and sugar as sugar use has been linked to worsening of memory function.  There is always a concern of gradual progression of memory problems. If this is the case, then we may need to adjust level of care according to patient needs. Support, both to the patient and caregiver, should then be put into place.   FALL PRECAUTIONS: Be cautious when walking. Scan the area for obstacles that may increase the risk of trips and falls. When getting up in the mornings, sit up at the edge of  the bed for a few minutes before getting out of bed. Consider elevating the bed at the head end to avoid drop of blood pressure when getting up. Walk always in a well-lit room (use night lights in the walls). Avoid area rugs or power cords from appliances in the middle of the walkways. Use a walker or a cane if necessary and consider physical therapy for balance exercise. Get your eyesight checked regularly.  FINANCIAL OVERSIGHT: Supervision, especially oversight when making financial decisions or transactions is also recommended.  HOME SAFETY: Consider the safety of the kitchen when operating appliances like stoves, microwave oven, and blender. Consider having supervision and share cooking responsibilities until no longer able to participate in those. Accidents with firearms and other hazards in the house should be identified and addressed as well.   ABILITY TO BE LEFT ALONE: If patient is unable to contact 911 operator, consider using LifeLine, or when the need is there, arrange for someone to stay with patients. Smoking is a fire hazard, consider supervision or cessation. Risk of wandering should be assessed by caregiver and if detected at any point, supervision and safe proof recommendations should be instituted.  MEDICATION SUPERVISION: Inability to self-administer medication needs to be constantly addressed. Implement a mechanism to ensure safe administration of the medications.   DRIVING: Regarding driving, in patients with progressive memory problems, driving will be impaired. We advise to have someone else do the driving if trouble finding directions or if minor accidents are reported. Independent driving assessment is available to determine safety of driving.   If you are interested in the  driving assessment, you can contact the following:  [facility] [employer] [facility] in [place] [phone]  [facility] [phone]  [facility] [phone] or [phone]    Mediterranean Diet A Mediterranean diet refers to food and lifestyle choices that are based on the traditions of countries located on the [employer]. This way of eating has been shown to help prevent certain conditions and improve outcomes for people who have chronic diseases, like kidney disease and heart disease. What are tips for following this plan? Lifestyle  Cook and eat meals together with your family, when possible. Drink enough fluid to keep your urine clear or pale yellow. Be physically active every day. This includes: Aerobic exercise like running or swimming. Leisure activities like gardening, walking, or housework. Get 7-8 hours of sleep each night. If recommended by your health care provider, drink red wine in moderation. This means 1 glass a day for nonpregnant women and 2 glasses a day for men. A glass of wine equals 5 oz (150 mL). Reading food labels  Check the serving size of packaged foods. For foods such as rice and pasta, the serving size refers to the amount of cooked product, not dry. Check the total fat in packaged foods. Avoid foods that have saturated fat or trans fats. Check the ingredients list for added sugars, such as corn syrup. Shopping  At the grocery store, buy most of your food from the areas near the walls of the store. This includes: Fresh fruits and vegetables (produce). Grains, beans, nuts, and seeds. Some of these may be available in unpackaged forms or large amounts (in bulk). Fresh seafood. Poultry and eggs. Low-fat dairy products. Buy whole ingredients instead of prepackaged foods. Buy fresh fruits and vegetables in-season from local farmers markets. Buy frozen fruits and vegetables in resealable bags. If you do not have access to quality fresh seafood, buy precooked frozen shrimp or canned fish, such as tuna, salmon, or sardines. Buy small amounts of raw or cooked vegetables, salads, or olives from  the deli or salad bar at your store. Stock your pantry so you always have certain foods on hand, such as olive oil, canned tuna, canned tomatoes, rice, pasta, and beans. Cooking  Cook foods with extra-virgin olive oil instead of using butter or other vegetable oils. Have meat as a side dish, and have vegetables or grains as your main dish. This means having meat in small portions or adding small amounts of meat to foods like pasta or stew. Use beans or vegetables instead of meat in common dishes like chili or lasagna. Experiment with different cooking methods. Try roasting or broiling vegetables instead of steaming or sauteing them. Add frozen vegetables to soups, stews, pasta, or rice. Add nuts or seeds for added healthy fat at each meal. You can add these to yogurt, salads, or vegetable dishes. Marinate fish or vegetables using olive oil, lemon juice, garlic, and fresh herbs. Meal planning  Plan to eat 1 vegetarian meal one day each week. Try to work up to 2 vegetarian meals, if possible. Eat seafood 2 or more times a week. Have healthy snacks readily available, such as: Vegetable sticks with hummus. Greek yogurt. Fruit and nut trail mix. Eat balanced meals throughout the week. This includes: Fruit: 2-3 servings a day Vegetables: 4-5 servings a day Low-fat dairy: 2 servings a day Fish, poultry, or lean meat: 1 serving a day Beans and legumes: 2 or more servings a week Nuts and seeds:  1-2 servings a day Whole grains: 6-8 servings a day Extra-virgin olive oil: 3-4 servings a day Limit red meat and sweets to only a few servings a month What are my food choices? Mediterranean diet Recommended Grains: Whole-grain pasta. Brown rice. Bulgar wheat. Polenta. Couscous. Whole-wheat bread. Orpah Cobb. Vegetables: Artichokes. Beets. Broccoli. Cabbage. Carrots. Eggplant. Green beans. Chard. Kale. Spinach. Onions. Leeks. Peas. Squash. Tomatoes. Peppers. Radishes. Fruits: Apples. Apricots.  Avocado. Berries. Bananas. Cherries. Dates. Figs. Grapes. Lemons. Melon. Oranges. Peaches. Plums. Pomegranate. Meats and other protein foods: Beans. Almonds. Sunflower seeds. Pine nuts. Peanuts. Cod. Salmon. Scallops. Shrimp. Tuna. Tilapia. Clams. Oysters. Eggs. Dairy: Low-fat milk. Cheese. Greek yogurt. Beverages: Water. Red wine. Herbal tea. Fats and oils: Extra virgin olive oil. Avocado oil. Grape seed oil. Sweets and desserts: [place] yogurt with honey. Baked apples. Poached pears. Trail mix. Seasoning and other foods: Basil. Cilantro. Coriander. Cumin. Mint. Parsley. Sage. Rosemary. Tarragon. Garlic. Oregano. Thyme. Pepper. Balsalmic vinegar. Tahini. Hummus. Tomato sauce. Olives. Mushrooms. Limit these Grains: Prepackaged pasta or rice dishes. Prepackaged cereal with added sugar. Vegetables: Deep fried potatoes (french fries). Fruits: Fruit canned in syrup. Meats and other protein foods: Beef. Pork. Lamb. Poultry with skin. Hot dogs. Tomasa Blase. Dairy: Ice cream. Sour cream. Whole milk. Beverages: Juice. Sugar-sweetened soft drinks. Beer. Liquor and spirits. Fats and oils: Butter. Canola oil. Vegetable oil. Beef fat (tallow). Lard. Sweets and desserts: Cookies. Cakes. Pies. Candy. Seasoning and other foods: Mayonnaise. Premade sauces and marinades. The items listed may not be a complete list. Talk with your dietitian about what dietary choices are right for you. Summary The Mediterranean diet includes both food and lifestyle choices. Eat a variety of fresh fruits and vegetables, beans, nuts, seeds, and whole grains. Limit the amount of red meat and sweets that you eat. Talk with your health care provider about whether it is safe for you to drink red wine in moderation. This means 1 glass a day for nonpregnant women and 2 glasses a day for men. A glass of wine equals 5 oz (150 mL). This information is not intended to replace advice given to you by your health care provider. Make sure you discuss  any questions you have with your health care provider. Document Released: 07/18/2016 Document Revised: 08/20/2016 Document Reviewed: 07/18/2016 Elsevier Interactive Patient Education  2017 [employer].
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
## 2021-11-22 ENCOUNTER — Encounter: Payer: Self-pay | Admitting: Surgery

## 2021-11-22 ENCOUNTER — Ambulatory Visit (INDEPENDENT_AMBULATORY_CARE_PROVIDER_SITE_OTHER): Payer: BC Managed Care – PPO | Admitting: Surgery

## 2021-11-22 ENCOUNTER — Ambulatory Visit: Payer: Self-pay

## 2021-11-22 VITALS — BP 131/87 | HR 60 | Ht 66.0 in | Wt 187.0 lb

## 2021-11-22 DIAGNOSIS — M542 Cervicalgia: Secondary | ICD-10-CM

## 2021-11-22 DIAGNOSIS — M4722 Other spondylosis with radiculopathy, cervical region: Secondary | ICD-10-CM

## 2021-11-22 DIAGNOSIS — M1712 Unilateral primary osteoarthritis, left knee: Secondary | ICD-10-CM

## 2021-11-22 DIAGNOSIS — M170 Bilateral primary osteoarthritis of knee: Secondary | ICD-10-CM

## 2021-11-22 DIAGNOSIS — M2550 Pain in unspecified joint: Secondary | ICD-10-CM

## 2021-11-22 DIAGNOSIS — M1711 Unilateral primary osteoarthritis, right knee: Secondary | ICD-10-CM | POA: Diagnosis not present

## 2021-11-22 NOTE — Progress Notes (Signed)
Office Visit Note   Patient: Sara Rich           Date of Birth: May 26, 1976           MRN: [id number] Visit Date: 11/22/2021              Requested by: [name], MD [address],  [place] [id number] PCP: Pcp, No   Assessment & Plan: Visit Diagnoses:  1. Neck pain   2. Primary osteoarthritis of both knees   3. Other spondylosis with radiculopathy, cervical region   4. Polyarthralgia     Plan: Patient will proceed with cervical spine MRI that was previously ordered by referring physician.  In hopes of giving her some improvement of her bilateral knee pain I did offer bilateral knee intra-articular Marcaine/Depo-Medrol injections.  After patient consent bilateral knees were prepped with Betadine and injections were performed.  Blood work was also drawn today to check a CBC and arthritis panel.  She will follow-up with Dr. [name] in a few weeks for recheck to discuss results of the cervical MRI, blood work and discuss potential treatment options for the knees.  Follow-Up Instructions: Return in about 4 weeks (around 12/20/2021) for WITH DR [name] TO REVIEW CERVICAL MRI, LABS AND RECHECK BILAT KNEE .   Orders:  Orders Placed This Encounter  Procedures   Large Joint Inj: bilateral knee   XR Cervical Spine 2 or 3 views   CBC   Antinuclear Antib (ANA)   Rheumatoid Factor   Uric acid   Sed Rate (ESR)   HLA-B27 antigen   No orders of the defined types were placed in this encounter.      Procedures: Large Joint Inj: bilateral knee on 11/22/2021 10:46 AM Indications: pain Details: 25 G 1.5 in needle, anteromedial approach Medications (Right): 3 mL lidocaine 1 %; 6 mL bupivacaine 0.25 %; 40 mg methylPREDNISolone acetate 40 MG/ML Medications (Left): 3 mL lidocaine 1 %; 6 mL bupivacaine 0.25 %; 40 mg methylPREDNISolone acetate 40 MG/ML Consent was given by the patient. Patient was prepped and draped in the usual sterile fashion.      Clinical Data: No  additional findings.   Subjective: Chief Complaint  Patient presents with   Neck - Pain   Right Knee - Pain   Left Knee - Pain    HPI 45 year old female who is new patient to clinic comes in with complaints of neck pain, bilateral upper extremity radiculopathy and bilateral knee pain.  Patient speaks [place] and comes in with an interpreter.  Patient's had off-and-on cervical spine issues were a few years.  She has been followed recently by Dr. Asencion Sara Rich her primary care provider.  Last seen by him December for 2022 and he already has an order in place for a cervical spine MRI.  Neck pain more posterior and radiates down to both arms.  Patient also complaining of bilateral knee pain that is increased with standing, squatting and going up and down stairs.  No injury.  For the neck and knee issues she is tried oral NSAIDs without any improvement.  Referring physician ordered bilateral knee x-rays and these were done November 13, 2021.  Report showed:   CLINICAL DATA:  Chronic bilateral knee pain for 5 months. Swelling and lateral tenderness.   EXAM: RIGHT KNEE - COMPLETE 4+ VIEW   COMPARISON:  None.   FINDINGS: Valgus angulation. Lateral tibiofemoral joint space narrowing with moderate peripheral osteophytes and subchondral cystic change. There is mild  patellofemoral degenerative change with osteophytes, as well as the central femoral osteophyte. Small knee joint effusion. No fracture, erosion, or focal bone abnormality.   IMPRESSION: 1. Valgus angulation. 2. Moderate osteoarthritis, most prominently affecting lateral tibiofemoral compartment. Small knee joint effusion.     Narrative & Impression  CLINICAL DATA:  Chronic bilateral knee pain for 5 months. Swelling and lateral tenderness   EXAM: LEFT KNEE - COMPLETE 4+ VIEW   COMPARISON:  None.   FINDINGS: Slight lateral patellar subluxation, otherwise normal alignment. Lateral patellofemoral joint space narrowing.  Minimal medial tibiofemoral joint space narrowing. Moderate tricompartmental peripheral spurring as well as spurring of the tibial spines. No fracture or erosion. No focal bone abnormality. Minimal knee joint effusion. Fine osseous density adjacent to the anterior tibial spine may represent sequela of remote injury or an unfused apophysis.   IMPRESSION: 1. Moderate tricompartmental osteoarthritis with minimal joint effusion. 2. Slight lateral patellar subluxation.              Review of Systems No current cardiac pulmonary GI GU issues  Objective: Vital Signs: BP 131/87    Pulse 60    Ht _0  (1.676 m)    Wt 187 lb (84.8 kg)    BMI 30.18 kg/m   Physical Exam HENT:     Head: Normocephalic and atraumatic.     Nose: Nose normal.  Pulmonary:     Effort: No respiratory distress.  Musculoskeletal:     Comments: Gait is antalgic.  Positive bilateral brachial plexus trapezius and scapular tenderness.  Positive Spurling test.  All shoulders good range of motion but with some discomfort.  Bilateral elbows unremarkable.  Bilateral hand she has some tenderness over the MCP joints with slight ulnar deviation at the joints.  Negative log about his.  Bilateral knees positive patellofemoral crepitus.  Positive bilateral lateral greater than medial joint line tenderness.  No palpable knee effusions.  Calves are nontender.  Neurological:     General: No focal deficit present.     Mental Status: She is alert.    Ortho Exam  Specialty Comments:  No specialty comments available.  Imaging: No results found.   PMFS History: Patient Active Problem List   Diagnosis Date Noted   Memory loss 11/12/2021   Chronic pain of both knees 11/12/2021   Cervical radiculopathy 11/12/2021   Paresthesia of right upper extremity 08/16/2020   Pain of finger of left hand 08/16/2020   Pain involving joint of finger of left hand 12/13/2019   Neck pain, musculoskeletal 08/03/2018   Cyst of left ovary  08/03/2018   Past Medical History:  Diagnosis Date   Known health problems: none     Family History  Problem Relation Age of Onset   Diabetes Neg Hx    Hypertension Neg Hx    Cancer Neg Hx     Past Surgical History:  Procedure Laterality Date   surgery on left index finger     Social History   Occupational History   Occupation: Works at a [employer]  Tobacco Use   Smoking status: Never   Smokeless tobacco: Never  Vaping Use   Vaping Use: Never used  Substance and Sexual Activity   Alcohol use: Never   Drug use: Never   Sexual activity: Not on file

## 2021-11-24 LAB — CBC
HCT: 37.3 % (ref 35.0–45.0)
Hemoglobin: 11.9 g/dL (ref 11.7–15.5)
MCH: 26.8 pg — ABNORMAL LOW (ref 27.0–33.0)
MCHC: 31.9 g/dL — ABNORMAL LOW (ref 32.0–36.0)
MCV: 84 fL (ref 80.0–100.0)
MPV: 10.4 fL (ref 7.5–12.5)
Platelets: 375 10*3/uL (ref 140–400)
RBC: 4.44 10*6/uL (ref 3.80–5.10)
RDW: 13.2 % (ref 11.0–15.0)
WBC: 4.9 10*3/uL (ref 3.8–10.8)

## 2021-11-24 LAB — URIC ACID: Uric Acid, Serum: 3.6 mg/dL (ref 2.5–7.0)

## 2021-11-24 LAB — RHEUMATOID FACTOR: Rheumatoid fact SerPl-aCnc: <14 IU/mL (ref <14)

## 2021-11-24 LAB — HLA-B27 ANTIGEN: HLA-B27 Antigen: NEGATIVE

## 2021-11-24 LAB — SEDIMENTATION RATE: Sed Rate: 14 mm/h (ref 0–20)

## 2021-11-24 LAB — ANA: Anti Nuclear Antibody (ANA): NEGATIVE

## 2021-11-26 ENCOUNTER — Ambulatory Visit (INDEPENDENT_AMBULATORY_CARE_PROVIDER_SITE_OTHER): Payer: BC Managed Care – PPO | Admitting: Neurology

## 2021-11-26 ENCOUNTER — Other Ambulatory Visit: Payer: Self-pay

## 2021-11-26 DIAGNOSIS — R413 Other amnesia: Secondary | ICD-10-CM | POA: Diagnosis not present

## 2021-11-26 NOTE — Progress Notes (Signed)
No answer at 1018 11/26/2021

## 2021-11-27 NOTE — Procedures (Signed)
ELECTROENCEPHALOGRAM REPORT  Date of Study: 11/26/2021  Patient's Name: Zendaya Groseclose MRN: [id number] Date of Birth: 1975-12-17  Referring Provider: Marlowe Kays, PA-C  Clinical History: This is a 45 year old woman with episodes of incomprehensible speech, behavioral changes. EEG for classification.  Medications: Naprosyn  Technical Summary: A multichannel digital EEG recording measured by the international 10-20 system with electrodes applied with paste and impedances below 5000 ohms performed in our laboratory with EKG monitoring in an awake patient.  Hyperventilation was not performed. Photic stimulation was performed.  The digital EEG was referentially recorded, reformatted, and digitally filtered in a variety of bipolar and referential montages for optimal display.    Description: The patient is awake during the recording.  During maximal wakefulness, there is a symmetric, medium voltage 10-10.5 Hz posterior dominant rhythm that attenuates with eye opening.  The record is symmetric.  Sleep was not captured. Photic stimulation did not elicit any abnormalities.  There were no epileptiform discharges or electrographic seizures seen.    EKG lead was unremarkable.  Impression: This awake EEG is normal.    Clinical Correlation: A normal EEG does not exclude a clinical diagnosis of epilepsy.  If further clinical questions remain, prolonged EEG may be helpful.  Clinical correlation is advised.   Patrcia Dolly, M.D.

## 2021-12-14 MED ORDER — BUPIVACAINE HCL 0.25 % IJ SOLN
6.0000 mL | INTRAMUSCULAR | Status: AC | PRN
  Administered 2021-11-22: 6 mL via INTRA_ARTICULAR

## 2021-12-14 MED ORDER — METHYLPREDNISOLONE ACETATE 40 MG/ML IJ SUSP
40.0000 mg | INTRAMUSCULAR | Status: AC | PRN
  Administered 2021-11-22: 40 mg via INTRA_ARTICULAR

## 2021-12-14 MED ORDER — LIDOCAINE HCL 1 % IJ SOLN
3.0000 mL | INTRAMUSCULAR | Status: AC | PRN
  Administered 2021-11-22: 3 mL

## 2021-12-19 ENCOUNTER — Other Ambulatory Visit: Payer: Self-pay

## 2021-12-19 ENCOUNTER — Ambulatory Visit (INDEPENDENT_AMBULATORY_CARE_PROVIDER_SITE_OTHER): Payer: BC Managed Care – PPO | Admitting: Orthopaedic Surgery

## 2021-12-19 ENCOUNTER — Encounter: Payer: Self-pay | Admitting: Orthopaedic Surgery

## 2021-12-19 DIAGNOSIS — M5020 Other cervical disc displacement, unspecified cervical region: Secondary | ICD-10-CM | POA: Diagnosis not present

## 2021-12-19 NOTE — Progress Notes (Signed)
Office Visit Note   Patient: Sara Rich           Date of Birth: 16-Nov-1976           MRN: [id number] Visit Date: 12/19/2021              Requested by: No referring provider defined for this encounter. PCP: Pcp, No   Assessment & Plan: Visit Diagnoses:  1. Protrusion of cervical intervertebral disc     Plan: Patient has pending MRI of the brain and MRI of the cervical spine.  She can follow-up with me after her imaging studies.  She will call and check to find out about scheduling time.  Follow-Up Instructions: Return if symptoms worsen or fail to improve.   Orders:  No orders of the defined types were placed in this encounter.  No orders of the defined types were placed in this encounter.     Procedures: No procedures performed   Clinical Data: No additional findings.   Subjective: Chief Complaint  Patient presents with   Neck - Pain, Follow-up   Right Knee - Pain, Follow-up   Left Knee - Pain, Follow-up    HPI 46 year old female from [place] here with an interpreter.  She had problems with her knees but states today her knees are doing better.  She had problems with pain in her neck and had an MRI scan request putting on 11/12/2021 by Dr. Shan Levans and this apparently is still pending.  She has had pain in her neck that radiates into her arms pain when she turns her neck.  Previous MRI scan in 2021 showed cervical spondylosis some mild stenosis at C6-7 see report below.  Pathic gait symptoms.  Review of Systems all other systems updated noncontributory.   Objective: Vital Signs: BP (!) 146/92    Pulse 62    Ht 5\' 6"  (1.676 m)    Wt 187 lb (84.8 kg)    BMI 30.18 kg/m   Physical Exam Constitutional:      Appearance: She is well-developed.  HENT:     Head: Normocephalic.     Right Ear: External ear normal.     Left Ear: External ear normal. There is no impacted cerumen.  Eyes:     Pupils: Pupils are equal, round, and reactive to light.  Neck:      Thyroid: No thyromegaly.     Trachea: No tracheal deviation.  Cardiovascular:     Rate and Rhythm: Normal rate.  Pulmonary:     Effort: Pulmonary effort is normal.  Abdominal:     Palpations: Abdomen is soft.  Musculoskeletal:     Cervical back: No rigidity.  Skin:    General: Skin is warm and dry.  Neurological:     Mental Status: She is alert and oriented to person, place, and time.  Psychiatric:        Behavior: Behavior normal.    Ortho Exam no rash of exposed skin good cervical range of motion.  Normal heel toe gait.  Specialty Comments:  No specialty comments available.  Imaging: Narrative & Impression  CLINICAL DATA:  Neck pain, acute, infection suspected.   EXAM: MRI CERVICAL SPINE WITHOUT AND WITH CONTRAST   TECHNIQUE: Multiplanar and multiecho pulse sequences of the cervical spine, to include the craniocervical junction and cervicothoracic junction, were obtained without and with intravenous contrast.   CONTRAST:  13mL GADAVIST GADOBUTROL 1 MMOL/ML IV SOLN   COMPARISON:  None.   FINDINGS: Alignment: Cervical spine  straightening.  No listhesis.   Vertebrae: No fracture, suspicious osseous lesion, significant marrow edema, or evidence of discitis.   Cord: Normal cord signal.  No abnormal intradural enhancement.   Posterior Fossa, vertebral arteries, paraspinal tissues: Unremarkable.   Disc levels:   C2-3: Negative.   C3-4: Mild disc bulging and mild uncovertebral spurring result in mild right greater than left neural foraminal stenosis without spinal stenosis.   C4-5: Mild disc bulging results in mild left neural foraminal stenosis without spinal stenosis.   C5-6: Minimal disc bulging without stenosis.   C6-7: Disc bulging and a superimposed central to left central disc protrusion result in mild spinal stenosis without significant neural foraminal stenosis.   C7-T1: Negative.   IMPRESSION: 1. No evidence of infection or other acute  osseous abnormality in the cervical spine. 2. Mild cervical spondylosis with mild spinal stenosis at C6-7.     Electronically Signed   By: Sebastian Ache M.D.   On: 12/01/2020 15:54     PMFS History: Patient Active Problem List   Diagnosis Date Noted   Protrusion of cervical intervertebral disc 12/19/2021   Memory loss 11/12/2021   Chronic pain of both knees 11/12/2021   Cervical radiculopathy 11/12/2021   Paresthesia of right upper extremity 08/16/2020   Pain of finger of left hand 08/16/2020   Pain involving joint of finger of left hand 12/13/2019   Neck pain, musculoskeletal 08/03/2018   Cyst of left ovary 08/03/2018   Past Medical History:  Diagnosis Date   Known health problems: none     Family History  Problem Relation Age of Onset   Diabetes Neg Hx    Hypertension Neg Hx    Cancer Neg Hx     Past Surgical History:  Procedure Laterality Date   surgery on left index finger     Social History   Occupational History   Occupation: Works at a [employer]  Tobacco Use   Smoking status: Never   Smokeless tobacco: Never  Vaping Use   Vaping Use: Never used  Substance and Sexual Activity   Alcohol use: Never   Drug use: Never   Sexual activity: Not on file

## 2021-12-26 ENCOUNTER — Other Ambulatory Visit: Payer: Self-pay

## 2021-12-26 ENCOUNTER — Encounter: Payer: Self-pay | Admitting: Physician Assistant

## 2021-12-26 ENCOUNTER — Ambulatory Visit (INDEPENDENT_AMBULATORY_CARE_PROVIDER_SITE_OTHER): Payer: BC Managed Care – PPO | Admitting: Physician Assistant

## 2021-12-26 VITALS — BP 130/88 | HR 62 | Resp 18 | Ht 66.0 in | Wt 185.0 lb

## 2021-12-26 DIAGNOSIS — R413 Other amnesia: Secondary | ICD-10-CM | POA: Diagnosis not present

## 2021-12-26 NOTE — Patient Instructions (Addendum)
It was a pleasure to see you today at our office.   Recommendations:  MRI of the brain Follow up is pending on the results of the MRI  Follow up with Orthopedics about the neck pain  Referral to Primary care [place] [facility] at [facility] j, also to establish Behavioral Therapy care to to take care of the emotional issues   RECOMMENDATIONS FOR ALL PATIENTS WITH MEMORY PROBLEMS: 1. Continue to exercise (Recommend 30 minutes of walking everyday, or 3 hours every week) 2. Increase social interactions - continue going to [place] and enjoy social gatherings with friends and family 3. Eat healthy, avoid fried foods and eat more fruits and vegetables 4. Maintain adequate blood pressure, blood sugar, and blood cholesterol level. Reducing the risk of stroke and cardiovascular disease also helps promoting better memory. 5. Avoid stressful situations. Live a simple life and avoid aggravations. Organize your time and prepare for the next day in anticipation. 6. Sleep well, avoid any interruptions of sleep and avoid any distractions in the bedroom that may interfere with adequate sleep quality 7. Avoid sugar, avoid sweets as there is a strong link between excessive sugar intake, diabetes, and cognitive impairment We discussed the Mediterranean diet, which has been shown to help patients reduce the risk of progressive memory disorders and reduces cardiovascular risk. This includes eating fish, eat fruits and green leafy vegetables, nuts like almonds and hazelnuts, walnuts, and also use olive oil. Avoid fast foods and fried foods as much as possible. Avoid sweets and sugar as sugar use has been linked to worsening of memory function.  There is always a concern of gradual progression of memory problems. If this is the case, then we may need to adjust level of care according to patient needs. Support, both to the patient and caregiver, should then be put into place.   FALL PRECAUTIONS: Be cautious when  walking. Scan the area for obstacles that may increase the risk of trips and falls. When getting up in the mornings, sit up at the edge of the bed for a few minutes before getting out of bed. Consider elevating the bed at the head end to avoid drop of blood pressure when getting up. Walk always in a well-lit room (use night lights in the walls). Avoid area rugs or power cords from appliances in the middle of the walkways. Use a walker or a cane if necessary and consider physical therapy for balance exercise. Get your eyesight checked regularly.  FINANCIAL OVERSIGHT: Supervision, especially oversight when making financial decisions or transactions is also recommended.  HOME SAFETY: Consider the safety of the kitchen when operating appliances like stoves, microwave oven, and blender. Consider having supervision and share cooking responsibilities until no longer able to participate in those. Accidents with firearms and other hazards in the house should be identified and addressed as well.   ABILITY TO BE LEFT ALONE: If patient is unable to contact 911 operator, consider using LifeLine, or when the need is there, arrange for someone to stay with patients. Smoking is a fire hazard, consider supervision or cessation. Risk of wandering should be assessed by caregiver and if detected at any point, supervision and safe proof recommendations should be instituted.  MEDICATION SUPERVISION: Inability to self-administer medication needs to be constantly addressed. Implement a mechanism to ensure safe administration of the medications.   DRIVING: Regarding driving, in patients with progressive memory problems, driving will be impaired. We advise to have someone else do the driving if trouble finding directions  or if minor accidents are reported. Independent driving assessment is available to determine safety of driving.   If you are interested in the driving assessment, you can contact the following:  [facility]  [employer] in [place] [phone]  [facility] [phone]  [facility] [phone]  [facility] [phone] or [phone]    Mediterranean Diet A Mediterranean diet refers to food and lifestyle choices that are based on the traditions of countries located on the [place]. This way of eating has been shown to help prevent certain conditions and improve outcomes for people who have chronic diseases, like kidney disease and heart disease. What are tips for following this plan? Lifestyle  Cook and eat meals together with your family, when possible. Drink enough fluid to keep your urine clear or pale yellow. Be physically active every day. This includes: Aerobic exercise like running or swimming. Leisure activities like gardening, walking, or housework. Get 7-8 hours of sleep each night. If recommended by your health care provider, drink red wine in moderation. This means 1 glass a day for nonpregnant women and 2 glasses a day for men. A glass of wine equals 5 oz (150 mL). Reading food labels  Check the serving size of packaged foods. For foods such as rice and pasta, the serving size refers to the amount of cooked product, not dry. Check the total fat in packaged foods. Avoid foods that have saturated fat or trans fats. Check the ingredients list for added sugars, such as corn syrup. Shopping  At the grocery store, buy most of your food from the areas near the walls of the store. This includes: Fresh fruits and vegetables (produce). Grains, beans, nuts, and seeds. Some of these may be available in unpackaged forms or large amounts (in bulk). Fresh seafood. Poultry and eggs. Low-fat dairy products. Buy whole ingredients instead of prepackaged foods. Buy fresh fruits and vegetables in-season from local farmers markets. Buy frozen fruits and vegetables in resealable bags. If you do not have access to quality fresh seafood, buy  precooked frozen shrimp or canned fish, such as tuna, salmon, or sardines. Buy small amounts of raw or cooked vegetables, salads, or olives from the deli or salad bar at your store. Stock your pantry so you always have certain foods on hand, such as olive oil, canned tuna, canned tomatoes, rice, pasta, and beans. Cooking  Cook foods with extra-virgin olive oil instead of using butter or other vegetable oils. Have meat as a side dish, and have vegetables or grains as your main dish. This means having meat in small portions or adding small amounts of meat to foods like pasta or stew. Use beans or vegetables instead of meat in common dishes like chili or lasagna. Experiment with different cooking methods. Try roasting or broiling vegetables instead of steaming or sauteing them. Add frozen vegetables to soups, stews, pasta, or rice. Add nuts or seeds for added healthy fat at each meal. You can add these to yogurt, salads, or vegetable dishes. Marinate fish or vegetables using olive oil, lemon juice, garlic, and fresh herbs. Meal planning  Plan to eat 1 vegetarian meal one day each week. Try to work up to 2 vegetarian meals, if possible. Eat seafood 2 or more times a week. Have healthy snacks readily available, such as: Vegetable sticks with hummus. Greek yogurt. Fruit and nut trail mix. Eat balanced meals throughout the week. This includes: Fruit: 2-3 servings a day Vegetables: 4-5 servings a day Low-fat dairy: 2  servings a day Fish, poultry, or lean meat: 1 serving a day Beans and legumes: 2 or more servings a week Nuts and seeds: 1-2 servings a day Whole grains: 6-8 servings a day Extra-virgin olive oil: 3-4 servings a day Limit red meat and sweets to only a few servings a month What are my food choices? Mediterranean diet Recommended Grains: Whole-grain pasta. Brown rice. Bulgar wheat. Polenta. Couscous. Whole-wheat bread. Orpah Cobb. Vegetables: Artichokes. Beets. Broccoli.  Cabbage. Carrots. Eggplant. Green beans. Chard. Kale. Spinach. Onions. Leeks. Peas. Squash. Tomatoes. Peppers. Radishes. Fruits: Apples. Apricots. Avocado. Berries. Bananas. Cherries. Dates. Figs. Grapes. Lemons. Melon. Oranges. Peaches. Plums. Pomegranate. Meats and other protein foods: Beans. Almonds. Sunflower seeds. Pine nuts. Peanuts. Cod. Salmon. Scallops. Shrimp. Tuna. Tilapia. Clams. Oysters. Eggs. Dairy: Low-fat milk. Cheese. Greek yogurt. Beverages: Water. Red wine. Herbal tea. Fats and oils: Extra virgin olive oil. Avocado oil. Grape seed oil. Sweets and desserts: [place] yogurt with honey. Baked apples. Poached pears. Trail mix. Seasoning and other foods: Basil. Cilantro. Coriander. Cumin. Mint. Parsley. Sage. Rosemary. Tarragon. Garlic. Oregano. Thyme. Pepper. Balsalmic vinegar. Tahini. Hummus. Tomato sauce. Olives. Mushrooms. Limit these Grains: Prepackaged pasta or rice dishes. Prepackaged cereal with added sugar. Vegetables: Deep fried potatoes (french fries). Fruits: Fruit canned in syrup. Meats and other protein foods: Beef. Pork. Lamb. Poultry with skin. Hot dogs. Tomasa Blase. Dairy: Ice cream. Sour cream. Whole milk. Beverages: Juice. Sugar-sweetened soft drinks. Beer. Liquor and spirits. Fats and oils: Butter. Canola oil. Vegetable oil. Beef fat (tallow). Lard. Sweets and desserts: Cookies. Cakes. Pies. Candy. Seasoning and other foods: Mayonnaise. Premade sauces and marinades. The items listed may not be a complete list. Talk with your dietitian about what dietary choices are right for you. Summary The Mediterranean diet includes both food and lifestyle choices. Eat a variety of fresh fruits and vegetables, beans, nuts, seeds, and whole grains. Limit the amount of red meat and sweets that you eat. Talk with your health care provider about whether it is safe for you to drink red wine in moderation. This means 1 glass a day for nonpregnant women and 2 glasses a day for men. A glass  of wine equals 5 oz (150 mL). This information is not intended to replace advice given to you by your health care provider. Make sure you discuss any questions you have with your health care provider. Document Released: 07/18/2016 Document Revised: 08/20/2016 Document Reviewed: 07/18/2016 Elsevier Interactive Patient Education  2017 [employer].
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
## 2022-01-01 ENCOUNTER — Ambulatory Visit
Admission: RE | Admit: 2022-01-01 | Discharge: 2022-01-01 | Disposition: A | Discharge status: Home / Self Care | Payer: BC Managed Care – PPO | Source: Ambulatory Visit | Attending: Critical Care Medicine | Admitting: Critical Care Medicine

## 2022-01-01 ENCOUNTER — Ambulatory Visit
Admission: RE | Admit: 2022-01-01 | Discharge: 2022-01-01 | Disposition: A | Discharge status: Home / Self Care | Payer: BC Managed Care – PPO | Source: Ambulatory Visit | Attending: Physician Assistant | Admitting: Physician Assistant

## 2022-01-01 ENCOUNTER — Other Ambulatory Visit: Payer: Self-pay

## 2022-01-01 DIAGNOSIS — R413 Other amnesia: Secondary | ICD-10-CM

## 2022-01-01 DIAGNOSIS — M5412 Radiculopathy, cervical region: Secondary | ICD-10-CM

## 2022-01-01 DIAGNOSIS — R202 Paresthesia of skin: Secondary | ICD-10-CM
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
## 2022-01-07 ENCOUNTER — Telehealth: Payer: Self-pay

## 2022-01-07 NOTE — Telephone Encounter (Signed)
Pt was called and vm was left, Information has been sent to nurse pool and letter will be sent.  Interpreter YS#[id number]

## 2022-01-07 NOTE — Telephone Encounter (Signed)
-----   Message from [name], MD sent at 01/03/2022  5:47 AM EST ----- Wenda Low, (adding Marlowe Kays and Dr Ophelia Charter) Call pt and let he know MRI of brain is normal.  Her cervical MRI is unchanged from 2021 and shows mild disease, recommend she follow up with [facility] wertman of neurology and Dr Ophelia Charter

## 2022-02-19 ENCOUNTER — Ambulatory Visit (INDEPENDENT_AMBULATORY_CARE_PROVIDER_SITE_OTHER): Payer: BC Managed Care – PPO | Admitting: Primary Care

## 2022-02-21 ENCOUNTER — Ambulatory Visit (INDEPENDENT_AMBULATORY_CARE_PROVIDER_SITE_OTHER): Payer: BC Managed Care – PPO | Admitting: Primary Care

## 2022-03-13 ENCOUNTER — Ambulatory Visit: Payer: BC Managed Care – PPO | Admitting: Critical Care Medicine

## 2022-05-15 ENCOUNTER — Ambulatory Visit: Payer: Self-pay

## 2022-05-15 NOTE — Telephone Encounter (Signed)
Interpreter Di Kindle [phone]  Left VM on pt's son to call back to discuss. Routing to practice for 3 unsuccessful attempts.  Pt is having issues with neck pain / please advise / pts son call back # [phone]

## 2022-05-15 NOTE — Telephone Encounter (Signed)
Pt is having issues with neck pain / please advise / pts son call back # [phone]   Left message to call back about symptoms. Used [place] Engineer, water [phone].

## 2022-05-15 NOTE — Telephone Encounter (Signed)
Second attempt to reach pt. And son. Left message to call back.

## 2022-05-16 NOTE — Telephone Encounter (Signed)
Call placed to number provided and no answer. Per epic patient was being followed by orthopedics for her neck [ain. She will need to reach out to them to schedule an appointment.

## 2022-07-08 ENCOUNTER — Encounter: Payer: Self-pay | Admitting: Family Medicine

## 2022-07-08 ENCOUNTER — Telehealth: Payer: Self-pay

## 2022-07-08 ENCOUNTER — Other Ambulatory Visit: Payer: Self-pay

## 2022-07-08 ENCOUNTER — Ambulatory Visit: Payer: BC Managed Care – PPO | Attending: Family Medicine | Admitting: Family Medicine

## 2022-07-08 VITALS — BP 135/85 | HR 63 | Temp 98.4°F | Ht 65.0 in | Wt 185.5 lb

## 2022-07-08 DIAGNOSIS — M4802 Spinal stenosis, cervical region: Secondary | ICD-10-CM | POA: Diagnosis not present

## 2022-07-08 MED ORDER — CYCLOBENZAPRINE HCL 10 MG PO TABS
10.0000 mg | ORAL_TABLET | Freq: Three times a day (TID) | ORAL | 1 refills | Status: DC | PRN
  Filled 2022-07-08: qty 60, 20d supply, fill #0

## 2022-07-08 MED ORDER — DULOXETINE HCL 60 MG PO CPEP
60.0000 mg | ORAL_CAPSULE | Freq: Every day | ORAL | 3 refills | Status: AC
  Filled 2022-07-08: qty 30, 30d supply, fill #0

## 2022-07-08 NOTE — Patient Instructions (Signed)
Cervical Radiculopathy  Cervical radiculopathy means that a nerve in the neck (a cervical nerve) is pinched or bruised. This can happen because of an injury to the cervical spine (vertebrae) in the neck, or as a normal part of getting older. This condition can cause pain or loss of feeling (numbness) that runs from your neck all the way down to your arm and fingers. Often, this condition gets better with rest. Treatment may be needed if the condition does not get better. What are the causes? A neck injury. A bulging disk in your spine. Sudden muscle tightening (muscle spasms). Tight muscles in your neck due to overuse. Arthritis. Breakdown in the bones and joints of the spine (spondylosis) due to getting older. Bone spurs that form near the nerves in the neck. What are the signs or symptoms? Pain. The pain may: Run from the neck to the arm and hand. Be very bad or irritating. Get worse when you move your neck. Loss of feeling or tingling in your arm or hand. Weakness in your arm or hand, in very bad cases. How is this treated? In many cases, treatment is not needed for this condition. With rest, the condition often gets better over time. If treatment is needed, options may include: Wearing a soft neck collar (cervical collar) for short periods of time. Doing exercises (physical therapy) to strengthen your neck muscles. Taking medicines. Having shots (injections) in your spine, in very bad cases. Having surgery. This may be needed if other treatments do not help. The type of surgery that is used will depend on the cause of your condition. Follow these instructions at home: If you have a soft neck collar: Wear it as told by your doctor. Take it off only as told by your doctor. Ask your doctor if you can take the collar off for cleaning and bathing. If you are allowed to take the collar off for cleaning or bathing: Follow instructions from your doctor about how to take off the collar  safely. Clean the collar by wiping it with mild soap and water and drying it completely. Take out any removable pads in the collar every 1-2 days. Wash them by hand with soap and water. Let them air-dry completely before you put them back in the collar. Check your skin under the collar for redness or sores. If you see any, tell your doctor. Managing pain     Take over-the-counter and prescription medicines only as told by your doctor. If told, put ice on the painful area. To do this: If you have a soft neck collar, take if off as told by your doctor. Put ice in a plastic bag. Place a towel between your skin and the bag. Leave the ice on for 20 minutes, 2-3 times a day. Take off the ice if your skin turns bright red. This is very important. If you cannot feel pain, heat, or cold, you have a greater risk of damage to the area. If using ice does not help, you can try using heat. Use the heat source that your doctor recommends, such as a moist heat pack or a heating pad. Place a towel between your skin and the heat source. Leave the heat on for 20-30 minutes. Take off the heat if your skin turns bright red. This is very important. If you cannot feel pain, heat, or cold, you have a greater risk of getting burned. You may try a gentle neck and shoulder rub (massage). Activity Rest as needed. Return   to your normal activities when your doctor says that it is safe. Do exercises as told by your doctor or physical therapist. You may have to avoid lifting. Ask your doctor how much you can safely lift. General instructions Use a flat pillow when you sleep. Do not drive while wearing a soft neck collar. If you do not have a soft neck collar, ask your doctor if it is safe to drive while your neck heals. Ask your doctor if you should avoid driving or using machines while you are taking your medicine. Do not smoke or use any products that contain nicotine or tobacco. If you need help quitting, ask your  doctor. Keep all follow-up visits. Contact a doctor if: Your condition does not get better with treatment. Get help right away if: Your pain gets worse and medicine does not help. You lose feeling or feel weak in your hand, arm, face, or leg. You have a high fever. Your neck is stiff. You cannot control when you poop or pee (have incontinence). You have trouble with walking, balance, or talking. Summary Cervical radiculopathy means that a nerve in the neck is pinched or bruised. A nerve can get pinched from a bulging disk, arthritis, an injury to the neck, or other causes. Symptoms include pain, tingling, or loss of feeling that goes from the neck to the arm or hand. Weakness in your arm or hand can happen in very bad cases. Treatment may include resting, wearing a soft neck collar, and doing exercises. You might need to take medicines for pain. In very bad cases, shots or surgery may be needed. This information is not intended to replace advice given to you by your health care provider. Make sure you discuss any questions you have with your health care provider. Document Revised: 05/31/2021 Document Reviewed: 05/31/2021 Elsevier Patient Education  2023 [employer]  
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
## 2022-07-08 NOTE — Telephone Encounter (Signed)
[place] Interpreter assistance provided by [employer] interpreter. Name: Alanson Aly ID # [id number].

## 2022-07-09 ENCOUNTER — Other Ambulatory Visit: Payer: Self-pay | Admitting: Family Medicine

## 2022-07-09 LAB — BASIC METABOLIC PANEL
BUN/Creatinine Ratio: 18 (ref 9–23)
BUN: 16 mg/dL (ref 6–24)
CO2: 21 mmol/L (ref 20–29)
Calcium: 11.8 mg/dL — ABNORMAL HIGH (ref 8.7–10.2)
Chloride: 105 mmol/L (ref 96–106)
Creatinine, Ser: 0.9 mg/dL (ref 0.57–1.00)
Glucose: 84 mg/dL (ref 70–99)
Potassium: 4.1 mmol/L (ref 3.5–5.2)
Sodium: 141 mmol/L (ref 134–144)
eGFR: 79 mL/min/{1.73_m2} (ref >59)

## 2022-08-08 ENCOUNTER — Telehealth: Payer: Self-pay

## 2022-08-08 NOTE — Telephone Encounter (Signed)
Routing to PCP for review.

## 2022-08-08 NOTE — Telephone Encounter (Signed)
Copied from CRM [id number]. Topic: General - Inquiry >> Aug 08, 2022  2:44 PM Lyman Speller wrote: Reason for CRM: Pts left hand index finger is preventing her from working properly / pt needs a provider letter to give her restrictions or excuse her from working a certain pace due to difficulties / if she doesn't get this letter by Monday they will release her from employment / please avdise asap

## 2022-08-09 NOTE — Telephone Encounter (Signed)
We did not discuss any problems with her finger at her office visit.  She does have neck pain which is managed by orthopedics, Dr. Ophelia Charter and I would recommend she direct this request to him.

## 2022-08-14 NOTE — Telephone Encounter (Signed)
Call placed to patient and VM was left informing patient to return phone call. 

## 2022-10-10 ENCOUNTER — Ambulatory Visit: Payer: BC Managed Care – PPO | Admitting: Family Medicine

## 2022-11-09 ENCOUNTER — Encounter (HOSPITAL_COMMUNITY): Payer: Self-pay

## 2022-11-09 ENCOUNTER — Other Ambulatory Visit: Payer: Self-pay

## 2022-11-09 ENCOUNTER — Emergency Department (HOSPITAL_COMMUNITY): Payer: Medicaid Other

## 2022-11-09 ENCOUNTER — Emergency Department (HOSPITAL_COMMUNITY)
Admission: EM | Admit: 2022-11-09 | Discharge: 2022-11-09 | Disposition: A | Discharge status: Home / Self Care | Payer: Medicaid Other | Attending: Student | Admitting: Student

## 2022-11-09 DIAGNOSIS — M542 Cervicalgia: Secondary | ICD-10-CM | POA: Diagnosis present

## 2022-11-09 DIAGNOSIS — R200 Anesthesia of skin: Secondary | ICD-10-CM | POA: Insufficient documentation

## 2022-11-09 DIAGNOSIS — M5412 Radiculopathy, cervical region: Secondary | ICD-10-CM

## 2022-11-09 DIAGNOSIS — M5020 Other cervical disc displacement, unspecified cervical region: Secondary | ICD-10-CM

## 2022-11-09 MED ORDER — NAPROXEN 375 MG PO TABS: 375.0000 mg | ORAL_TABLET | Freq: Two times a day (BID) | ORAL | 0 refills | Status: DC

## 2022-11-09 MED ORDER — LIDOCAINE 5 % EX PTCH
1.0000 | MEDICATED_PATCH | CUTANEOUS | Status: DC
  Administered 2022-11-09: 1 via TRANSDERMAL
  Filled 2022-11-09: qty 1

## 2022-11-09 MED ORDER — KETOROLAC TROMETHAMINE 15 MG/ML IJ SOLN
15.0000 mg | Freq: Once | INTRAMUSCULAR | Status: AC
Start: 2022-11-09 — End: 2022-11-09
  Administered 2022-11-09: 15 mg via INTRAMUSCULAR
  Filled 2022-11-09: qty 1

## 2022-11-09 MED ORDER — LIDOCAINE 5 % EX PTCH: 1.0000 | MEDICATED_PATCH | CUTANEOUS | 0 refills | Status: AC

## 2022-11-09 MED ORDER — DEXAMETHASONE SODIUM PHOSPHATE 10 MG/ML IJ SOLN
10.0000 mg | Freq: Once | INTRAMUSCULAR | Status: AC
  Administered 2022-11-09: 10 mg via INTRAMUSCULAR
  Filled 2022-11-09: qty 1

## 2022-11-09 MED ORDER — METHYLPREDNISOLONE 4 MG PO TBPK: ORAL_TABLET | ORAL | 0 refills | Status: AC

## 2022-11-09 NOTE — ED Provider Triage Note (Signed)
Emergency Medicine Provider Triage Evaluation Note  Sara Rich , a 46 y.o. female  was evaluated in triage.  Pt complains of neck pain x 3 days. Describes it as a "burning in the bone, not the flesh or the outside". Also having pain in her left knee. No injury that she knows of.   Attempted to use a french interpreter during triage with difficulty.   Review of Systems  Positive: Neck pain Negative: Fever, cough, difficulty swallowing, numbness, tingling  Physical Exam  BP (!) 156/98 (BP Location: Right Arm)   Pulse 64   Temp 98.5 F (36.9 C)   Resp 14   SpO2 97%  Gen:   Awake, no distress   Resp:  Normal effort  MSK:   Moves extremities without difficulty  Other:    Medical Decision Making  Medically screening exam initiated at 11:27 AM.  Appropriate orders placed.  Magalene Eslick was informed that the remainder of the evaluation will be completed by another provider, this initial triage assessment does not replace that evaluation, and the importance of remaining in the ED until their evaluation is complete.     Kathe Wirick T, PA-C 11/09/22 1128

## 2022-11-09 NOTE — ED Triage Notes (Signed)
Pt arrived POV from home c/o pain in the back of her neck and feels like it is on fire.

## 2022-11-09 NOTE — ED Provider Notes (Signed)
[facility] EMERGENCY DEPARTMENT Provider Note  CSN: [id number] Arrival date & time: 11/09/22 1055  Chief Complaint(s) Neck Pain  HPI Sara Rich is a 46 y.o. female with PMH cervical spinal degeneration worst at C6-C7 with a chronic endplate degeneration with mild spinal canal stenosis and ventral cord mass effect who presents emergency department for evaluation of neck pain and a generalized sensation of her "whole body burning".  Patient last MRI in January 2023.  She states that she has had difficulties with her neck pain for a long time but this acutely worsened in the last 24 hours and is endorsing numbness and tingling of the fingers.  Patient follows with Dr. Annell Greening of orthopedic surgery for this but has not seen him since prior to the MRI C-spine in January 2023.  Denies new trauma to the neck.  Denies nausea, vomiting, headache, fever or other systemic symptoms.   Past Medical History Past Medical History:  Diagnosis Date   Known health problems: none    Patient Active Problem List   Diagnosis Date Noted   Protrusion of cervical intervertebral disc 12/19/2021   Memory loss 11/12/2021   Chronic pain of both knees 11/12/2021   Cervical radiculopathy 11/12/2021   Paresthesia of right upper extremity 08/16/2020   Pain of finger of left hand 08/16/2020   Pain involving joint of finger of left hand 12/13/2019   Neck pain, musculoskeletal 08/03/2018   Cyst of left ovary 08/03/2018   Home Medication(s) Prior to Admission medications   Medication Sig Start Date End Date Taking? Authorizing Provider  cyclobenzaprine (FLEXERIL) 10 MG tablet Take 1 tablet (10 mg total) by mouth 3 (three) times daily as needed for muscle spasms. 07/08/22   Hoy Register, MD  DULoxetine (CYMBALTA) 60 MG capsule Take 1 capsule (60 mg total) by mouth daily. For cervical radiculopathy 07/08/22   Hoy Register, MD                                                                                                                                     Past Surgical History Past Surgical History:  Procedure Laterality Date   surgery on left index finger     Family History Family History  Problem Relation Age of Onset   Diabetes Neg Hx    Hypertension Neg Hx    Cancer Neg Hx     Social History Social History   Tobacco Use   Smoking status: Never   Smokeless tobacco: Never  Vaping Use   Vaping Use: Never used  Substance Use Topics   Alcohol use: Never   Drug use: Never   Allergies Patient has no known allergies.  Review of Systems Review of Systems  Musculoskeletal:  Positive for myalgias and neck pain.  Neurological:  Positive for numbness.    Physical Exam Vital Signs  I have reviewed the triage vital signs BP (!) 156/98 (BP Location: Right  Arm)   Pulse 64   Temp 98.5 F (36.9 C)   Resp 14   SpO2 97%   Physical Exam Vitals and nursing note reviewed.  Constitutional:      General: She is not in acute distress.    Appearance: She is well-developed.  HENT:     Head: Normocephalic and atraumatic.  Eyes:     Conjunctiva/sclera: Conjunctivae normal.  Cardiovascular:     Rate and Rhythm: Normal rate and regular rhythm.     Heart sounds: No murmur heard. Pulmonary:     Effort: Pulmonary effort is normal. No respiratory distress.     Breath sounds: Normal breath sounds.  Abdominal:     Palpations: Abdomen is soft.     Tenderness: There is no abdominal tenderness.  Musculoskeletal:        General: Tenderness present. No swelling.     Cervical back: Neck supple.  Skin:    General: Skin is warm and dry.     Capillary Refill: Capillary refill takes less than 2 seconds.  Neurological:     Mental Status: She is alert.     Sensory: Sensory deficit (Subjective bilateral tip numbness) present.  Psychiatric:        Mood and Affect: Mood normal.     ED Results and Treatments Labs (all labs ordered are listed, but only abnormal results are  displayed) Labs Reviewed  COMPREHENSIVE METABOLIC PANEL  CBC WITH DIFFERENTIAL/PLATELET                                                                                                                          Radiology No results found.  Pertinent labs & imaging results that were available during my care of the patient were reviewed by me and considered in my medical decision making (see MDM for details).  Medications Ordered in ED Medications  ketorolac (TORADOL) 15 MG/ML injection 15 mg (has no administration in time range)  lidocaine (LIDODERM) 5 % 1 patch (has no administration in time range)                                                                                                                                     Procedures Procedures  (including critical care time)  Medical Decision Making / ED Course   This patient presents to the ED for concern of neck pain, this involves an extensive number  of treatment options, and is a complaint that carries with it a high risk of complications and morbidity.  The differential diagnosis includes cervical radiculopathy, fracture, disc herniation, musculoskeletal pain  MDM: Patient seen emergency room for evaluation of neck pain and radicular symptoms.  Physical exam with no focal weakness but there is some subjective numbness at the fingertips bilaterally.  Tenderness at C6-C7.  MRI C-spine obtained given concern for progressive disc herniation given worsening symptoms and known history of a disc bulge that does show a progressed central disc herniation at C6-C7 that is now contacting the ventral spinal cord with mild spinal stenosis with mild mass effect but no cord signal abnormality.  Patient given Decadron and Toradol for pain control and symptoms did improve.  I discussed the MRI with the neurosurgical nurse practitioner on-call Doran Durand over epic chat who states that patient is fine for outpatient follow-up with either neurosurgery  or Dr. Ophelia Charter of orthopedics.  Amatory referral was placed to both.  Patient discharged on Naprosyn and Medrol Dosepak with return precautions given.   Additional history obtained: -Additional history obtained from husband -External records from outside source obtained and reviewed including: Chart review including previous notes, labs, imaging, consultation notes   Lab Tests: -I ordered, reviewed, and interpreted labs.   The pertinent results include:   Labs Reviewed  COMPREHENSIVE METABOLIC PANEL  CBC WITH DIFFERENTIAL/PLATELET       Imaging Studies ordered: I ordered imaging studies including MRI C spine I independently visualized and interpreted imaging. I agree with the radiologist interpretation   Medicines ordered and prescription drug management: Meds ordered this encounter  Medications   ketorolac (TORADOL) 15 MG/ML injection 15 mg   lidocaine (LIDODERM) 5 % 1 patch    -I have reviewed the patients home medicines and have made adjustments as needed  Critical interventions none  Cardiac Monitoring: The patient was maintained on a cardiac monitor.  I personally viewed and interpreted the cardiac monitored which showed an underlying rhythm of: NSR  Social Determinants of Health:  Factors impacting patients care include: french speaking   Reevaluation: After the interventions noted above, I reevaluated the patient and found that they have :improved  Co morbidities that complicate the patient evaluation  Past Medical History:  Diagnosis Date   Known health problems: none       Dispostion: I considered admission for this patient, but she currently does not meet inpatient criteria for admission and is safe for discharge with outpatient neurosurgical/orthopedic follow-up     Final Clinical Impression(s) / ED Diagnoses Final diagnoses:  None     @PCDICTATION @    , MD 11/09/22 1935

## 2023-01-01 ENCOUNTER — Ambulatory Visit: Payer: BC Managed Care – PPO | Admitting: Family Medicine

## 2023-03-13 ENCOUNTER — Ambulatory Visit: Payer: BC Managed Care – PPO | Attending: Family Medicine | Admitting: Family Medicine

## 2023-03-13 ENCOUNTER — Encounter: Payer: Self-pay | Admitting: Family Medicine

## 2023-03-13 ENCOUNTER — Other Ambulatory Visit: Payer: Self-pay

## 2023-03-13 VITALS — BP 108/71 | HR 71 | Temp 98.3°F | Ht 66.0 in | Wt 195.0 lb

## 2023-03-13 DIAGNOSIS — R413 Other amnesia: Secondary | ICD-10-CM | POA: Diagnosis not present

## 2023-03-13 DIAGNOSIS — Z13228 Encounter for screening for other metabolic disorders: Secondary | ICD-10-CM | POA: Diagnosis not present

## 2023-03-13 DIAGNOSIS — G8929 Other chronic pain: Secondary | ICD-10-CM

## 2023-03-13 DIAGNOSIS — M25561 Pain in right knee: Secondary | ICD-10-CM

## 2023-03-13 DIAGNOSIS — M25562 Pain in left knee: Secondary | ICD-10-CM

## 2023-03-13 MED ORDER — NAPROXEN 500 MG PO TABS
500.0000 mg | ORAL_TABLET | Freq: Two times a day (BID) | ORAL | 1 refills | Status: DC
  Filled 2023-03-13: qty 60, 30d supply, fill #0
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
## 2023-03-13 NOTE — Progress Notes (Signed)
Needs letter for citizenship.

## 2023-03-13 NOTE — Patient Instructions (Signed)
Douleur chronique au genou chez l'adulte Chronic Knee Pain, Adult La douleur chronique au genou est une [employer] l'un des genoux, ou les deux, qui dure plus de 3 mois. Les symptmes de Engineer, technical sales chronique au genou peuvent notamment tre un gonflement, une raideur et une gne. La cause la plus frquente de douleur chronique au genou est l'usure normale de l'articulation du genou lie  l'ge (arthrose). D'autres causes possibles incluent : Une maladie immunitaire  long terme qui provoque une inflammation du genou (polyarthrite rhumatode). En gnral, les deux genoux sont atteints. Une arthrite inflammatoire, comme la goutte ou la pseudogoutte. Une blessure au genou entranant une arthrite. Une blessure au genou endommageant les ligaments. Les ligaments sont des bandes de tissu solide qui relient les os entre eux. Le syndrome rotulien ou Regulatory affairs officer genou. Le traitement de la douleur chronique au genou dpend de sa cause. Les principaux traitements de Engineer, technical sales chronique au genou sont la kinsithrapie et la perte de poids. Cette affection peut galement tre traite par [employer], des injections, une genouillre ou [place], et par [employer] de bquilles. Le traitement consistant  laisser le genou au repos en position surleve et  appliquer de la glace et une compression (pression) pourra galement tre recommand. Ce traitement est galement appel traitement RICE (repos, glace, compression, lvation). [place]phos les instructions suivantes  domicile : Si vous portez une genouillre ou une orthse :  Portez la genouillre ou l'orthse conformment aux indications de votre prestataire de soins de sant. Retirez-la uniquement lorsque votre prestataire de soins de sant vous y autorise. Desserrez-la si vous ressentez un fourmillement dans vos orteils, ou bien s'ils s'engourdissent ou deviennent froids et bleus. Gardez-la propre. Si la genouillre ou l'orthse n'est pas  tanche : Ne la laissez pas se mouiller. Retirez-la si votre prestataire de soins de sant l'autorise ou couvrez-la avec une protection tanche lorsque vous prenez un bain ou une douche. Prise en charge de Engineer, technical sales, de la rigidit et des gonflements     Si votre prestataire de soins de sant vous le recommande, appliquez de Event organiser sur la zone [place] conformment  ses instructions. Utilisez la source de Conservation officer, nature vous recommande votre prestataire de soins de sant, par exemple une compresse  chaleur humide ou un coussin chauffant. Si vous portez une genouillre ou une orthse amovible, retirez-la en suivant les indications de votre prestataire de soins de sant. Placez une serviette entre votre peau et la source de Librarian, academic. Laissez la source de chaleur en place pendant 20  30 minutes. Retirez la source de chaleur si votre peau devient rouge vif. Cela est particulirement important si vous ne ressentez Management consultant, la chaleur ou le froid. Vous risqueriez de vous brler. Si le prestataire de soins de sant vous le recommande, appliquez de la [employer] zone Oil Trough. Pour ce faire : Si vous portez une genouillre ou une orthse amovible, retirez-la en suivant les indications de votre prestataire de soins de sant. Mettez de la [employer] un sac en plastique. Placez une serviette entre votre peau et le sac. Laissez la glace en place pendant 20 minutes, 2  3 fois par jour. Retirez la glace si votre peau devient rouge vif. Cela est trs important. Si vous ne ressentez Management consultant, la chaleur ou le froid, vous pourriez endommager votre peau plus facilement. Bougez vos orteils rgulirement pour rduire la raideur [employer]. Soulevez (surlevez) la zone [employer] niveau de votre  cour pendant que vous tes assis(e) ou couch(e). Activits vitez les activits ou les exercices  impact lev, comme la course, le saut  la corde, ou les sauts sur place [employer]. [employer] le programme d'exercice que votre prestataire de soins de sant a conu pour vous. Votre prestataire de soins de sant pourra vous suggrer : D'viter les Psychiatric nurse au genou. Vous devrez peut-tre changer vos habitudes relatives  la pratique d'exercice physique, d'activits sportives ou  vos tches professionnelles. Portez des [employer] de semelles rembourres. vitez les sports qui ncessitent de courir et de Energy manager de direction. Faites des exercices de kinsithrapie. Les exercices de kinsithrapie sont conus pour rpondre  vos besoins et adapts  capacits. Ils peuvent inclure des exercices de musculation, de souplesse, d'quilibre et d'endurance. Faites des [employer] amliorent l'quilibre et la rsistance, comme le tai-chi et le yoga. N'utilisez pas le [employer] pour soutenir votre poids corporel tant que votre prestataire de soins de sant ne vous y a pas autoris(e). Utilisez des [employer] instructions de votre prestataire de soins de sant. Reprenez vos activits habituelles en veillant  suivre les recommandations de votre prestataire de soins de sant. Demandez  votre prestataire de soins de sant quelles activits sont sans danger pour vous. Instructions gnrales Prenez vos mdicaments en vente libre et sur ordonnance en suivant scrupuleusement les instructions de votre prestataire de soins de sant. Perdez du poids si vous tes en surpoids. Perdre du poids, mme un peu, peut rduire Wellsite geologist genou. Demandez  votre prestataire de soins de sant quel est votre poids idal et comment perdre du poids en toute scurit. Un ditticien pourra vous aider  planifier vos repas. N'utilisez pas de [employer] tabac ou de la nicotine, tels que les cigarettes, les cigarettes lectroniques et Musician. Ceux-ci peuvent ralentir la gurison. Si vous avez besoin d'aide pour arrter de fumer,  demandez conseil  votre prestataire de soins de sant. Rendez-vous  toutes vos visites de suivi. C'est important. Prenez Special educational needs teacher de soins de sant si : Vous Programmer, systems au genou qui ne diminue pas ou qui s'aggrave. Vous ne pouvez pas faire vos exercices de kinsithrapie  cause de votre douleur au genou. Demandez immdiatement de l'aide si : Votre genou enfle et que l'enflure s'aggrave. Vous ne pouvez plus bouger votre genou. Vous avez une douleur intense au genou. Rsum [employer] genou qui dure plus de 3 mois est considre comme une douleur chronique au genou. Les principaux traitements de Engineer, technical sales chronique au genou sont la kinsithrapie et la perte de poids. Vous devrez peut-tre galement prendre des [employer], porter une genouillre ou une orthse, Print production planner des bquilles et appliquer de la glace ou de Event organiser. Perdre du poids, mme un peu, peut rduire Wellsite geologist genou. Demandez  votre prestataire de soins de sant quel est votre poids idal et comment perdre du poids en toute scurit. Un ditticien pourra vous aider  planifier vos repas. [employer] le programme d'exercice que votre prestataire de soins de sant a conu pour vous. Ces conseils et renseignements ne sauraient se substituer  l'avis mdical de votre prestataire de soins de sant. Par consquent, il est primordial de parler de toutes vos proccupations avec votre prestataire de soins de sant. Document Revised: 06/27/2020 Document Reviewed: 06/27/2020 Elsevier Patient Education  [place].

## 2023-03-14 LAB — CMP14+EGFR
ALT: 12 IU/L (ref 0–32)
AST: 18 IU/L (ref 0–40)
Albumin/Globulin Ratio: 1.7 (ref 1.2–2.2)
Albumin: 4.3 g/dL (ref 3.9–4.9)
Alkaline Phosphatase: 74 IU/L (ref 44–121)
BUN/Creatinine Ratio: 13 (ref 9–23)
BUN: 9 mg/dL (ref 6–24)
Bilirubin Total: <0.2 mg/dL (ref 0.0–1.2)
CO2: 21 mmol/L (ref 20–29)
Calcium: 10.1 mg/dL (ref 8.7–10.2)
Chloride: 109 mmol/L — ABNORMAL HIGH (ref 96–106)
Creatinine, Ser: 0.68 mg/dL (ref 0.57–1.00)
Globulin, Total: 2.6 g/dL (ref 1.5–4.5)
Glucose: 64 mg/dL — ABNORMAL LOW (ref 70–99)
Potassium: 4.1 mmol/L (ref 3.5–5.2)
Sodium: 146 mmol/L — ABNORMAL HIGH (ref 134–144)
Total Protein: 6.9 g/dL (ref 6.0–8.5)
eGFR: 108 mL/min/{1.73_m2} (ref >59)

## 2023-03-14 LAB — CBC WITH DIFFERENTIAL/PLATELET
Basophils Absolute: 0.1 10*3/uL (ref 0.0–0.2)
Basos: 2 %
EOS (ABSOLUTE): 0.1 10*3/uL (ref 0.0–0.4)
Eos: 2 %
Hematocrit: 37.5 % (ref 34.0–46.6)
Hemoglobin: 11.9 g/dL (ref 11.1–15.9)
Immature Grans (Abs): 0 10*3/uL (ref 0.0–0.1)
Immature Granulocytes: 0 %
Lymphocytes Absolute: 2.3 10*3/uL (ref 0.7–3.1)
Lymphs: 43 %
MCH: 26.1 pg — ABNORMAL LOW (ref 26.6–33.0)
MCHC: 31.7 g/dL (ref 31.5–35.7)
MCV: 82 fL (ref 79–97)
Monocytes Absolute: 0.4 10*3/uL (ref 0.1–0.9)
Monocytes: 8 %
Neutrophils Absolute: 2.4 10*3/uL (ref 1.4–7.0)
Neutrophils: 45 %
Platelets: 378 10*3/uL (ref 150–450)
RBC: 4.56 x10E6/uL (ref 3.77–5.28)
RDW: 13.5 % (ref 11.7–15.4)
WBC: 5.2 10*3/uL (ref 3.4–10.8)

## 2023-03-14 LAB — HEMOGLOBIN A1C
Est. average glucose Bld gHb Est-mCnc: 111 mg/dL
Hgb A1c MFr Bld: 5.5 % (ref 4.8–5.6)

## 2023-03-14 LAB — LP+NON-HDL CHOLESTEROL
Cholesterol, Total: 195 mg/dL (ref 100–199)
HDL: 125 mg/dL (ref >39)
LDL Chol Calc (NIH): 61 mg/dL (ref 0–99)
Total Non-HDL-Chol (LDL+VLDL): 70 mg/dL (ref 0–129)
Triglycerides: 47 mg/dL (ref 0–149)
VLDL Cholesterol Cal: 9 mg/dL (ref 5–40)

## 2023-03-20 ENCOUNTER — Other Ambulatory Visit: Payer: Self-pay

## 2023-03-26 ENCOUNTER — Encounter: Payer: Self-pay | Admitting: *Deleted

## 2023-04-17 ENCOUNTER — Encounter: Payer: BC Managed Care – PPO | Admitting: Family Medicine

## 2023-05-14 ENCOUNTER — Encounter: Payer: BC Managed Care – PPO | Admitting: Family Medicine

## 2023-07-01 ENCOUNTER — Encounter: Payer: BC Managed Care – PPO | Admitting: Family Medicine

## 2023-07-22 ENCOUNTER — Encounter: Payer: Self-pay | Admitting: Family Medicine

## 2023-07-22 ENCOUNTER — Other Ambulatory Visit (HOSPITAL_COMMUNITY)
Admission: RE | Admit: 2023-07-22 | Discharge: 2023-07-22 | Disposition: A | Discharge status: Home / Self Care | Payer: Medicaid Other | Source: Ambulatory Visit | Attending: Family Medicine | Admitting: Family Medicine

## 2023-07-22 ENCOUNTER — Ambulatory Visit: Payer: BC Managed Care – PPO | Attending: Family Medicine | Admitting: Family Medicine

## 2023-07-22 VITALS — BP 132/82 | HR 59 | Ht 66.0 in | Wt 204.8 lb

## 2023-07-22 DIAGNOSIS — Z1211 Encounter for screening for malignant neoplasm of colon: Secondary | ICD-10-CM

## 2023-07-22 DIAGNOSIS — Z0000 Encounter for general adult medical examination without abnormal findings: Secondary | ICD-10-CM

## 2023-07-22 DIAGNOSIS — Z124 Encounter for screening for malignant neoplasm of cervix: Secondary | ICD-10-CM

## 2023-07-22 DIAGNOSIS — Z1231 Encounter for screening mammogram for malignant neoplasm of breast: Secondary | ICD-10-CM
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
## 2023-07-22 NOTE — Patient Instructions (Signed)

## 2023-12-16 ENCOUNTER — Emergency Department (HOSPITAL_COMMUNITY)
Admission: EM | Admit: 2023-12-16 | Discharge: 2023-12-16 | Disposition: A | Discharge status: Home / Self Care | Payer: Medicaid Other | Attending: Emergency Medicine | Admitting: Emergency Medicine

## 2023-12-16 ENCOUNTER — Other Ambulatory Visit: Payer: Self-pay

## 2023-12-16 DIAGNOSIS — M5412 Radiculopathy, cervical region: Secondary | ICD-10-CM | POA: Insufficient documentation

## 2023-12-16 DIAGNOSIS — M791 Myalgia, unspecified site: Secondary | ICD-10-CM | POA: Diagnosis present

## 2023-12-16 LAB — CBC WITH DIFFERENTIAL/PLATELET
Abs Immature Granulocytes: 0.02 10*3/uL (ref 0.00–0.07)
Basophils Absolute: 0.1 10*3/uL (ref 0.0–0.1)
Basophils Relative: 2 %
Eosinophils Absolute: 0.1 10*3/uL (ref 0.0–0.5)
Eosinophils Relative: 2 %
HCT: 42 % (ref 36.0–46.0)
Hemoglobin: 13 g/dL (ref 12.0–15.0)
Immature Granulocytes: 0 %
Lymphocytes Relative: 40 %
Lymphs Abs: 1.8 10*3/uL (ref 0.7–4.0)
MCH: 27 pg (ref 26.0–34.0)
MCHC: 31 g/dL (ref 30.0–36.0)
MCV: 87.1 fL (ref 80.0–100.0)
Monocytes Absolute: 0.5 10*3/uL (ref 0.1–1.0)
Monocytes Relative: 10 %
Neutro Abs: 2.1 10*3/uL (ref 1.7–7.7)
Neutrophils Relative %: 46 %
Platelets: 369 10*3/uL (ref 150–400)
RBC: 4.82 MIL/uL (ref 3.87–5.11)
RDW: 14.6 % (ref 11.5–15.5)
WBC: 4.7 10*3/uL (ref 4.0–10.5)
nRBC: 0 % (ref 0.0–0.2)

## 2023-12-16 LAB — URINALYSIS, ROUTINE W REFLEX MICROSCOPIC
Bilirubin Urine: NEGATIVE
Glucose, UA: NEGATIVE mg/dL
Ketones, ur: NEGATIVE mg/dL
Leukocytes,Ua: NEGATIVE
Nitrite: NEGATIVE
Protein, ur: NEGATIVE mg/dL
Specific Gravity, Urine: 1.023 (ref 1.005–1.030)
pH: 5 (ref 5.0–8.0)

## 2023-12-16 LAB — BASIC METABOLIC PANEL
Anion gap: 9 (ref 5–15)
BUN: 11 mg/dL (ref 6–20)
CO2: 23 mmol/L (ref 22–32)
Calcium: 9.4 mg/dL (ref 8.9–10.3)
Chloride: 109 mmol/L (ref 98–111)
Creatinine, Ser: 0.72 mg/dL (ref 0.44–1.00)
GFR, Estimated: >60 mL/min (ref >60)
Glucose, Bld: 98 mg/dL (ref 70–99)
Potassium: 3.8 mmol/L (ref 3.5–5.1)
Sodium: 141 mmol/L (ref 135–145)

## 2023-12-16 LAB — POC URINE PREG, ED: Preg Test, Ur: NEGATIVE

## 2023-12-16 MED ORDER — METHOCARBAMOL 500 MG PO TABS: 500.0000 mg | ORAL_TABLET | Freq: Two times a day (BID) | ORAL | 0 refills | Status: DC

## 2023-12-16 MED ORDER — ETODOLAC 400 MG PO TABS
400.0000 mg | ORAL_TABLET | Freq: Two times a day (BID) | ORAL | 0 refills | Status: AC
Start: 2023-12-16 — End: ?

## 2023-12-16 NOTE — ED Triage Notes (Signed)
 Son/translator/pt stated, Her body hurts all over and her right shoulder hurts a lot too started a week ago.

## 2023-12-16 NOTE — ED Provider Notes (Signed)
 [facility] Provider Note   CSN: [id number] Arrival date & time: 12/16/23  9086     History  Chief Complaint  Patient presents with   body pain   Shoulder Pain    Sara Rich is a 48 y.o. female.  48 year old female presents today for concern of generalized myalgias.  Also complains of neck pain.  She states she was seen for this previously and it just has not improved.  She was given a referral but she was unable to follow-up with the specialist at that time.  Denies weakness, or not being able to grip things.  URI symptoms.  Denies dark-colored urine.  No abdominal pain, nausea, or vomiting.  The history is provided by the patient. No language interpreter was used.       Home Medications Prior to Admission medications   Medication Sig Start Date End Date Taking? Authorizing Provider  cyclobenzaprine  (FLEXERIL ) 10 MG tablet Take 1 tablet (10 mg total) by mouth 3 (three) times daily as needed for muscle spasms. Patient not taking: Reported on 03/13/2023 07/08/22   Newlin, Enobong, MD  DULoxetine  (CYMBALTA ) 60 MG capsule Take 1 capsule (60 mg total) by mouth daily. For cervical radiculopathy Patient not taking: Reported on 03/13/2023 07/08/22   Newlin, Enobong, MD  lidocaine  (LIDODERM ) 5 % Place 1 patch onto the skin daily. Remove & Discard patch within 12 hours or as directed by MD Patient not taking: Reported on 03/13/2023 11/09/22   Kommor, Lum, MD  methylPREDNISolone  (MEDROL  DOSEPAK) 4 MG TBPK tablet Take as prescribed Patient not taking: Reported on 03/13/2023 11/09/22   Kommor, Lum, MD  naproxen  (NAPROSYN ) 500 MG tablet Take 1 tablet (500 mg total) by mouth 2 (two) times daily with a meal. 03/13/23   Delbert Clam, MD      Allergies    Patient has no known allergies.    Review of Systems   Review of Systems  Constitutional:  Negative for chills and fever.  Musculoskeletal:  Positive for myalgias and neck pain. Negative for  back pain and neck stiffness.  All other systems reviewed and are negative.   Physical Exam Updated Vital Signs BP (!) 145/97 (BP Location: Right Arm)   Pulse 62   Temp 98.9 F (37.2 C)   Resp 18   SpO2 97%  Physical Exam Vitals and nursing note reviewed.  Constitutional:      General: She is not in acute distress.    Appearance: Normal appearance. She is not ill-appearing.  HENT:     Head: Normocephalic and atraumatic.     Nose: Nose normal.  Eyes:     Conjunctiva/sclera: Conjunctivae normal.  Cardiovascular:     Rate and Rhythm: Normal rate.  Pulmonary:     Effort: Pulmonary effort is normal. No respiratory distress.  Musculoskeletal:        General: No deformity. Normal range of motion.     Cervical back: Normal range of motion. No rigidity or tenderness.     Comments: Cervical, thoracic, lumbar spine without tenderness to palpation.  Full range of motion of the cervical spine.  Full range of motion bilateral upper and lower extremities with 5/5 strength in extensor and flexor muscle groups.  Skin:    Findings: No rash.  Neurological:     Mental Status: She is alert.     ED Results / Procedures / Treatments   Labs (all labs ordered are listed, but only abnormal results are displayed) Labs  Reviewed  URINALYSIS, ROUTINE W REFLEX MICROSCOPIC - Abnormal; Notable for the following components:      Result Value   APPearance HAZY (*)    Hgb urine dipstick SMALL (*)    Bacteria, UA RARE (*)    All other components within normal limits  CBC WITH DIFFERENTIAL/PLATELET  BASIC METABOLIC PANEL  POC URINE PREG, ED    EKG None  Radiology No results found.  Procedures Procedures    Medications Ordered in ED Medications - No data to display  ED Course/ Medical Decision Making/ A&P                                 Medical Decision Making Amount and/or Complexity of Data Reviewed Labs: ordered.   48 year old female presents today for concern of generalized  myalgias.  She also complains of neck pain.  Previously seen for radiculopathy.  MRI was obtained at that time.  MRI findings included below.  But has disc herniation at C6-C7.  Will give referral to spinal clinic again.  Will give Lodine  and Robaxin .  Return precaution discussed.  Discussed follow-up with PCP.  Patient voices understanding and is in agreement with plan.  IMPRESSION: 1. 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 Mild mass effect but no cord signal abnormality. Stable mild right C7 foraminal stenosis is stable. 2. Elsewhere stable and generally mild for age cervical spine degeneration.    Final Clinical Impression(s) / ED Diagnoses Final diagnoses:  Cervical radiculopathy  Myalgia    Rx / DC Orders ED Discharge Orders          Ordered    methocarbamol  (ROBAXIN ) 500 MG tablet  2 times daily        12/16/23 1305    etodolac  (LODINE ) 400 MG tablet  2 times daily        12/16/23 1305              Hildegard Loge, PA-C 12/16/23 1305    Charlyn Sora, MD 12/16/23 1555

## 2023-12-16 NOTE — Discharge Instructions (Addendum)
 Your workup was reassuring.  Your previous MRI from previous visit showed that she had cervical disc herniation.  You are given a referral to a neurosurgeon.  I have listed the referral above for you.  For any concerning symptoms return to the emergency room otherwise establish care with PCP and follow-up.  I have sent in 2 medications for you 1 is called Robaxin  which is a muscle relaxant.  They will make you drowsy.  Do not drive after taking this medication.  The other medication is Lodine  which is an anti-inflammatory medication.  Do not combine this with ibuprofen  or Aleve .

## 2023-12-16 NOTE — ED Notes (Signed)
Pt discharged by Karen, RN

## 2024-07-29 ENCOUNTER — Ambulatory Visit: Payer: BC Managed Care – PPO | Attending: Family Medicine | Admitting: Family Medicine

## 2024-08-15 ENCOUNTER — Emergency Department (HOSPITAL_COMMUNITY)
Admission: EM | Admit: 2024-08-15 | Discharge: 2024-08-15 | Disposition: A | Discharge status: Home / Self Care | Attending: Emergency Medicine | Admitting: Emergency Medicine

## 2024-08-15 ENCOUNTER — Encounter (HOSPITAL_COMMUNITY): Payer: Self-pay | Admitting: *Deleted

## 2024-08-15 ENCOUNTER — Emergency Department (HOSPITAL_COMMUNITY)

## 2024-08-15 ENCOUNTER — Other Ambulatory Visit: Payer: Self-pay

## 2024-08-15 DIAGNOSIS — R42 Dizziness and giddiness: Secondary | ICD-10-CM | POA: Insufficient documentation

## 2024-08-15 DIAGNOSIS — G8929 Other chronic pain: Secondary | ICD-10-CM

## 2024-08-15 DIAGNOSIS — Y92410 Unspecified street and highway as the place of occurrence of the external cause: Secondary | ICD-10-CM | POA: Insufficient documentation

## 2024-08-15 DIAGNOSIS — R55 Syncope and collapse: Secondary | ICD-10-CM | POA: Diagnosis present

## 2024-08-15 HISTORY — DX: Dizziness and giddiness: R42

## 2024-08-15 LAB — COMPREHENSIVE METABOLIC PANEL WITH GFR
ALT: 13 U/L (ref 0–44)
AST: 21 U/L (ref 15–41)
Albumin: 3.3 g/dL — ABNORMAL LOW (ref 3.5–5.0)
Alkaline Phosphatase: 63 U/L (ref 38–126)
Anion gap: 11 (ref 5–15)
BUN: 8 mg/dL (ref 6–20)
CO2: 21 mmol/L — ABNORMAL LOW (ref 22–32)
Calcium: 9.4 mg/dL (ref 8.9–10.3)
Chloride: 108 mmol/L (ref 98–111)
Creatinine, Ser: 0.64 mg/dL (ref 0.44–1.00)
GFR, Estimated: >60 mL/min (ref >60)
Glucose, Bld: 78 mg/dL (ref 70–99)
Potassium: 3.7 mmol/L (ref 3.5–5.1)
Sodium: 140 mmol/L (ref 135–145)
Total Bilirubin: 0.6 mg/dL (ref 0.0–1.2)
Total Protein: 6.7 g/dL (ref 6.5–8.1)

## 2024-08-15 LAB — HCG, SERUM, QUALITATIVE: Preg, Serum: NEGATIVE

## 2024-08-15 LAB — CBC WITH DIFFERENTIAL/PLATELET
Abs Immature Granulocytes: 0.02 K/uL (ref 0.00–0.07)
Basophils Absolute: 0.1 K/uL (ref 0.0–0.1)
Basophils Relative: 1 %
Eosinophils Absolute: 0.1 K/uL (ref 0.0–0.5)
Eosinophils Relative: 1 %
HCT: 38.9 % (ref 36.0–46.0)
Hemoglobin: 12.1 g/dL (ref 12.0–15.0)
Immature Granulocytes: 0 %
Lymphocytes Relative: 33 %
Lymphs Abs: 2.3 K/uL (ref 0.7–4.0)
MCH: 26.3 pg (ref 26.0–34.0)
MCHC: 31.1 g/dL (ref 30.0–36.0)
MCV: 84.6 fL (ref 80.0–100.0)
Monocytes Absolute: 0.6 K/uL (ref 0.1–1.0)
Monocytes Relative: 8 %
Neutro Abs: 3.9 K/uL (ref 1.7–7.7)
Neutrophils Relative %: 57 %
Platelets: 334 K/uL (ref 150–400)
RBC: 4.6 MIL/uL (ref 3.87–5.11)
RDW: 14.7 % (ref 11.5–15.5)
WBC: 6.9 K/uL (ref 4.0–10.5)
nRBC: 0 % (ref 0.0–0.2)

## 2024-08-15 LAB — URINALYSIS, ROUTINE W REFLEX MICROSCOPIC
Bilirubin Urine: NEGATIVE
Glucose, UA: NEGATIVE mg/dL
Hgb urine dipstick: NEGATIVE
Ketones, ur: 5 mg/dL — AB
Leukocytes,Ua: NEGATIVE
Nitrite: NEGATIVE
Protein, ur: NEGATIVE mg/dL
Specific Gravity, Urine: 1.011 (ref 1.005–1.030)
pH: 6 (ref 5.0–8.0)

## 2024-08-15 LAB — CBG MONITORING, ED: Glucose-Capillary: 83 mg/dL (ref 70–99)

## 2024-08-15 MED ORDER — NAPROXEN 375 MG PO TABS
ORAL_TABLET | ORAL | 0 refills | Status: DC
  Filled 2024-08-15: qty 30, 15d supply, fill #0

## 2024-08-15 MED ORDER — METHOCARBAMOL 500 MG PO TABS
500.0000 mg | ORAL_TABLET | Freq: Two times a day (BID) | ORAL | 0 refills | Status: DC
Start: 2024-08-15 — End: 2024-08-15
  Filled 2024-08-15: qty 20, 10d supply, fill #0

## 2024-08-15 NOTE — ED Notes (Signed)
Patient does not have pacemaker

## 2024-08-15 NOTE — ED Notes (Signed)
 Patient transported to X-ray
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
## 2024-08-15 NOTE — ED Notes (Signed)
 C-collar removed with permission from PA.
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
## 2024-08-16 ENCOUNTER — Other Ambulatory Visit: Payer: Self-pay

## 2024-08-16 MED ORDER — NAPROXEN 375 MG PO TABS: ORAL_TABLET | ORAL | 0 refills | Status: AC

## 2024-08-16 MED ORDER — METHOCARBAMOL 500 MG PO TABS: 500.0000 mg | ORAL_TABLET | Freq: Two times a day (BID) | ORAL | 0 refills | Status: AC

## 2025-01-13 ENCOUNTER — Other Ambulatory Visit (HOSPITAL_COMMUNITY): Payer: Self-pay
